# Patient Record
Sex: Male | Born: 1965 | Race: White | Hispanic: No | Marital: Married | Smoking: Never smoker
Health system: Southern US, Community
[De-identification: ages and names within clinical notes are randomized; demographics above are authoritative.]

## PROBLEM LIST (undated history)

## (undated) DIAGNOSIS — G4730 Sleep apnea, unspecified: Secondary | ICD-10-CM

## (undated) DIAGNOSIS — C801 Malignant (primary) neoplasm, unspecified: Secondary | ICD-10-CM

## (undated) HISTORY — PX: HYDROCELE EXCISION: SHX482

## (undated) HISTORY — PX: FOOT FRACTURE SURGERY: SHX645

## (undated) HISTORY — PX: KNEE SURGERY: SHX244

## (undated) HISTORY — PX: COLONOSCOPY: SHX174

---

## 2010-10-20 ENCOUNTER — Ambulatory Visit (INDEPENDENT_AMBULATORY_CARE_PROVIDER_SITE_OTHER): Admitting: Sports Medicine

## 2010-10-20 VITALS — BP 120/80

## 2010-10-20 DIAGNOSIS — M7710 Lateral epicondylitis, unspecified elbow: Secondary | ICD-10-CM

## 2010-10-20 MED ORDER — NITROGLYCERIN 0.2 MG/HR TD PT24: MEDICATED_PATCH | TRANSDERMAL | Status: DC

## 2010-10-20 NOTE — Patient Instructions (Signed)
1. Do eccentric wrist exercises, 3 sets of 10 reps daily.  1. Down fast, up slow.  2. Rotate out fast, back slow.  2. Follow up in 4-6 weeks.  3. Wear your compression sleeve daily.  4. Over the counter ibuprofen as needed for pain.

## 2010-10-21 DIAGNOSIS — M7710 Lateral epicondylitis, unspecified elbow: Secondary | ICD-10-CM | POA: Insufficient documentation

## 2010-10-21 NOTE — Progress Notes (Signed)
  Subjective:    Patient ID: Thomas Marquez, male    DOB: Feb 21, 1965, 45 y.o.   MRN: [id number]  HPI 45 y/o male is here for a second opinion on his bilateral tennis elbow.  He has been treated with injections, NSAIDs, and physical therapy.  The right side responded reasonably well but the left contiues to be painful.  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   Review of Systems     Objective:   Physical Exam  Right Elbow: No tenderness to palpation No edema, ecchymosis or deformity Motion is normal  No pain with pronation or wrist flexion against resistance No pain with holding a book at 90 degrees with the arm fully extended  Left Elbow tenderness to palpation over the lateral epicondyle No edema, ecchymosis or deformity Motion is normal  Pain with pronation and wrist flexion against resistance Pain with holding a book a 90 degrees with the arm fully extended  Ultrasound:  Right elbow: viewed extensor tendons at the insertion in the long and short view. Minimal amt of fluid around the tendon sheath.  A tiny spur on the epicondyle ege and small calcifications c/w chronic inflammation right at the insertion point  Left elbow: viewed the extensor tendons at the insertion in the long and short view.  Moderate amount of fluid around the tendon sheath. Moderate sized calcification was visualized just distal to the insertion.        Assessment & Plan:

## 2010-10-21 NOTE — Assessment & Plan Note (Addendum)
Starting NTG therapy and eccentric exercises for rehab.  We gave him an elbow sleeve to wear for comfort on the right. He will follow up in 4-6 weeks.  At that time we will also evaluate his calf size discrepancy.

## 2010-11-17 ENCOUNTER — Ambulatory Visit (INDEPENDENT_AMBULATORY_CARE_PROVIDER_SITE_OTHER): Admitting: Sports Medicine

## 2010-11-17 ENCOUNTER — Encounter: Payer: Self-pay | Admitting: Sports Medicine

## 2010-11-17 VITALS — BP 117/73 | HR 60

## 2010-11-17 DIAGNOSIS — R269 Unspecified abnormalities of gait and mobility: Secondary | ICD-10-CM

## 2010-11-17 DIAGNOSIS — M7710 Lateral epicondylitis, unspecified elbow: Secondary | ICD-10-CM

## 2010-11-17 DIAGNOSIS — M2170 Unequal limb length (acquired), unspecified site: Secondary | ICD-10-CM

## 2010-11-17 NOTE — Patient Instructions (Signed)
1. Increase your patch to half a patch daily.  2. Start your exercises 3 sets of 10 once or twice daily.  3. Only wear during vigorous activity.  4. Do wrist exercises only to your comfort level.  5. Follow up in one month.

## 2010-11-17 NOTE — Assessment & Plan Note (Signed)
Increasing the NTG to 1/2 patch daily to achieve more absorption.  Wearing the elbow sleeve all day long delivered too much compression to the area.  He will only use it when he is doing certain aggravating activities at work or around the house.  He will back off on his rehab exercises until he can do them without much pain and then work back up.  We will see him again in about 5-6 weeks.

## 2010-11-17 NOTE — Assessment & Plan Note (Signed)
Will use lift in running shoes but if needed will add to other shoes

## 2010-11-17 NOTE — Progress Notes (Signed)
  Subjective:    Patient ID: Thomas Marquez, male    DOB: [birth year], 45 y.o.   MRN: [id number]  HPI  45 y/o male is here to follow up on left lateral epicondylitis.  The pain is somewhat worse.  He was initially wearing his elbow sleeve all day long and noticed the elbow is very achy at the end of the day.  This aching increases with his rehab exercises.  He has stopped wearing the elbow sleeve.    He is also c/o a difference in the size of his calves.  He started running about 3 years ago and noticed that the right calf has increased in size but the left is the same as before.  His wife notices that he has a slight limp with walking.  He sometimes gets right achilles tendon pain after running.    Review of Systems No headaches or dizziness    Objective:   Physical Exam  Left Elbow: No erythema or swelling Tender to palpation over the lateral epicondyle ROM normal No real change on MSK [place] of left elbow w intact extensor tendons and some linear calcification noted in proximal mm belly  Right leg measures 95 cm and left measures 93 cm The left calf is much smaller than the right Left leg is 1.5 cm shorter than RT No tenderness to palpation of either calf or achilles tendon The abductor and quad strength are normal at the hip FABER is non-painful, 45 degrees Flexes at the hip to about 45 degrees on the left and 60 degrees on the right  He has a valgus deviation of the left foot with neutral standing His posterior tibialis function is normal   Gait: He has a Trendelenburg gait.  He lands flat footed on the right side.  This is exaggerated with running.  His push off is all right sided.  He gets almost no push off on the left.  After putting the wedge on the left side there is some improvement.       Assessment & Plan:

## 2010-11-17 NOTE — Assessment & Plan Note (Signed)
Added a heel lift with wedge to the left foot  to start correcting the length difference.  The wedge should also decrease the valgus deviation of the left foot.

## 2010-12-22 ENCOUNTER — Ambulatory Visit (INDEPENDENT_AMBULATORY_CARE_PROVIDER_SITE_OTHER): Admitting: Sports Medicine

## 2010-12-22 VITALS — BP 126/80

## 2010-12-22 DIAGNOSIS — M79609 Pain in unspecified limb: Secondary | ICD-10-CM

## 2010-12-22 DIAGNOSIS — M79672 Pain in left foot: Secondary | ICD-10-CM

## 2010-12-22 DIAGNOSIS — M7710 Lateral epicondylitis, unspecified elbow: Secondary | ICD-10-CM

## 2010-12-22 MED ORDER — AMITRIPTYLINE HCL 25 MG PO TABS: 25.0000 mg | ORAL_TABLET | Freq: Every day | ORAL | Status: DC

## 2010-12-22 NOTE — Patient Instructions (Signed)
1. Continue to do your elbow exercises and your calf exercises.  2. Start wearing your arch straps daily.    3. If your heel pain doesn't improve over the next couple of weeks call to schedule an appointment for custom orthotics.  4. Start taking your amitriptyline at night.  A common side effect of this medication is sedation.  It usually resolves as you get used to the medication.  If it does not improve please give [place] a call.  5. Use capsaicin cream on your elbow 2-3 times a day.  6. Stop using your nitro patch.  7. Sleep with a pillow in your left arm.  8. Start doing your plantar fascia stretching exercises 1-2 times a day.  9. Schedule your next follow up appointment for 6 weeks.

## 2010-12-23 NOTE — Progress Notes (Signed)
  Subjective:    Patient ID: Thomas Marquez, male    DOB: 11/07/1965, 45 y.o.   MRN: [id number]  HPI 45 y/o male is here to follow up on his elbow pain and gait disturbance.  His elbow pain is essentially unchanged from before.  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  He has been wearing 1/2 NTG patch daily with no side effects.  He is doing well with his insoles.  However, he developed some heel pain on the right heel after he tried to return to running.   Review of Systems     Objective:   Physical Exam Elbow: No erythema, no deformity Tender over the lateral epicondyle Flexes, extends, supinates, and pronates without difficulty No pain with wrist extension against resistance  Pain is reproduced with lifting his sneaker from the exam table  Foot: No edema Tender to palpation at the insertion of the plantar fascia No tenderness otherwise  Ultrasound: Foot: Achilles tendon 0.55 CM at the insertion.  Calcifications noted inside the fascia.  Bone spur noted at the calcaneus.  Elbow: Extensor tendon intact.  Calcification that was previously noted at the epicondyle resolved.  There is no doppler activity.        Assessment & Plan:

## 2010-12-26 DIAGNOSIS — M79672 Pain in left foot: Secondary | ICD-10-CM | POA: Insufficient documentation

## 2010-12-26 NOTE — Assessment & Plan Note (Signed)
[place] evidence for lateral epicondylitis has resolved  Suspect there may be some neuropathic injury to left elbow  Add amitriptyline Keep up conservative exercises and streches
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
## 2011-02-04 ENCOUNTER — Ambulatory Visit (INDEPENDENT_AMBULATORY_CARE_PROVIDER_SITE_OTHER): Admitting: Sports Medicine

## 2011-02-04 VITALS — BP 110/70

## 2011-02-04 DIAGNOSIS — R269 Unspecified abnormalities of gait and mobility: Secondary | ICD-10-CM

## 2011-02-04 DIAGNOSIS — M7710 Lateral epicondylitis, unspecified elbow: Secondary | ICD-10-CM

## 2011-02-04 DIAGNOSIS — M2170 Unequal limb length (acquired), unspecified site: Secondary | ICD-10-CM

## 2011-02-04 DIAGNOSIS — M722 Plantar fascial fibromatosis: Secondary | ICD-10-CM | POA: Insufficient documentation

## 2011-02-04 NOTE — Progress Notes (Signed)
  Subjective:    Patient ID: Thomas Marquez, male    DOB: Feb 08, 1965, 46 y.o.   MRN: [id number]  HPI  Pt presents to clinic for f/u of lt elbow and heel pain. Elbow is about 30% improved. Started amitriptyline and did see improvement in pain, but stopped due to excessive daytime grogginess.  He used amitrip for 1 month  Left heel is unchanged Not clear that he has had improvement in sxs with sports insoles Has 2 cm leg length difference - never corrected Plantar fascia scan Did show thickening at 0.55 cm He also had calcifications and a small spur   Review of Systems     Objective:   Physical Exam   NAD Lt leg 2 cm shorter  Heel is somewhat ttp at medial insertion on left Atrophy of left lower leg compared to rt AT is non tender and no swelling  Gait with trendelenburg  Arch is moderate      Assessment & Plan:

## 2011-02-04 NOTE — Assessment & Plan Note (Signed)
This is improved Keep up exercises  I think he should try to use the amitriptyline for a longer period of time Try cutting the pill in half and using it for another 2 months Recheck after that time

## 2011-02-04 NOTE — Assessment & Plan Note (Signed)
This is improved with orthotic corrections

## 2011-02-04 NOTE — Patient Instructions (Signed)
Please try cutting the amitriptyline in half to see if this helps with daytime drowsiness  Wear new orthotics as much as possible  Continue doing exercises for left elbow and heel  Please follow up in 2 months  Happy New Year!

## 2011-02-04 NOTE — Assessment & Plan Note (Signed)
With his chronic symptoms we will try him in custom orthotics  Patient was fitted for a standard, cushioned, semi-rigid orthotic.  The orthotic was heated and the patient stood on the orthotic blank positioned on the orthotic stand. The patient was positioned in subtalar neutral position and 10 degrees of ankle dorsiflexion in a weight bearing stance. After molding, a stable Fast-Tech EVA base was applied to the orthotic blank.   The blank was ground to a stable position for weight bearing. Size: red cambray size 11 base: Blue EVA posting: Left tapered burgundy EVA 1 cm lift additional orthotic padding: nonetime 45 mins

## 2011-02-04 NOTE — Assessment & Plan Note (Signed)
Correction is added to the orthotic with a tapered 1 cm lift

## 2011-02-05 ENCOUNTER — Other Ambulatory Visit: Payer: Self-pay | Admitting: *Deleted

## 2011-02-05 MED ORDER — AMITRIPTYLINE HCL 10 MG PO TABS: 10.0000 mg | ORAL_TABLET | Freq: Every day | ORAL | Status: DC

## 2013-02-02 DIAGNOSIS — T8859XA Other complications of anesthesia, initial encounter: Secondary | ICD-10-CM

## 2013-02-02 HISTORY — DX: Other complications of anesthesia, initial encounter: T88.59XA

## 2013-03-06 ENCOUNTER — Ambulatory Visit: Payer: Self-pay | Admitting: Orthopedic Surgery

## 2013-04-04 ENCOUNTER — Ambulatory Visit: Payer: Self-pay | Admitting: Orthopedic Surgery

## 2014-05-26 NOTE — Op Note (Signed)
PATIENT NAME:  Thomas Marquez, Thomas Marquez MR#:  [id number] DATE OF BIRTH:  [birth year]  DATE OF PROCEDURE:  04/04/2013  PREOPERATIVE DIAGNOSIS: Left knee medial meniscus tear, degenerative joint disease.   POSTOPERATIVE DIAGNOSES:  1.  Medial meniscus tear, posterior horn.  2.  Large plica, medial aspect impinging on the medial femoral trochlea.  3.  Chondromalacia.   SURGEON: Dawayne Patricia, M.D.   ASSISTANT: None.   TOURNIQUET TIME:  0.  ANESTHESIA: General anesthesia.   SURGICAL FINDINGS: Large plica impinging on medial femoral trochlea with underlying grade III chondral wear. Grade I to II chondral wear medial and lateral compartments in weight-bearing surfaces. Large free edge tear, posterior horn medial meniscus.   INDICATIONS FOR PROCEDURE: Thomas Marquez is a 49 year old gentleman who was referred for consideration of surgical intervention. He and his wife had been doing a lot more running. He began to experience pain which he localized to the medial aspect of the knee. MRI confirmed the presence of some degenerative changes, as well as the presence of a free flap tear of the medial meniscus, posterior horn. Risks and benefits were explained to the patient and his wife. They agreed to proceed with surgical intervention.   The patient was identified in the preoperative holding area. Left lower extremity was marked as the operative site. The patient was brought into the operating room and general anesthesia was administered. The left lower extremity was prepared and draped in the usual sterile fashion. Tourniquet was applied but not inflated.   Timeout was performed identifying the patient, procedure, laterality, confirming antibiotic administration, confirming skin preparation and site marking.   Standard lateral viewing portal was made. Arthroscope was inserted into the patellofemoral articulation. There was very extensive synovitis throughout. The patient had a very large plical band covering a  large portion of the medial femoral trochlea. Medial and lateral gutters were checked for loose bodies. No loose bodies were present. The medial compartment was entered, and under direct visualization, medial portal was made. Probe was inserted through the medial portal and the medial meniscus was probed. The patient was found to have a large free edge flap tear of the posterior horn of medial meniscus. Using a combination of meniscal biters and shaver, a partial meniscectomy was performed, and the meniscal tear was resected back to a stable edge. Probe was inserted to evaluate the cartilaginous surfaces. Directly in line with the prior meniscal flap was a superficial defect in the medial femoral condyle. On probing, the patient is found to have significant softening of the medial femoral condyle and medial tibial plateau.  There was no significant cartilage loss noted. Attention was turned to the notch. The ACL and PCL were  visualized and intact. Attention was now turned to the lateral compartment. The lateral meniscus is probed along its entire surface and found to be stable and free of tearing. The lateral femoral condyle and lateral tibial plateau were probed, and again, cartilaginous surfaces and found to be quite soft without any significant defects noted.   At this time, attention was returned to the patellofemoral articulation. A probe was inserted and the plical band was retracted in order to reveal the underlying medial femoral trochlea. There was quite significant degenerative changes here. 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 Shaver and biters were used to resect the large plical band to ensure that it no longer impinged on the medial femoral trochlea. Undersurface of the patella is probed. This  is found to have some cartilaginous softening with no significant cartilage loss.   The joint was washed arthroscopically and drained.  Medial and lateral gutters were again checked for loose bodies. Instruments and camera were removed. Portals were closed using nylon suture. Sterile dressings were applied. The patient was placed in a hinged knee brace. He will follow up on Friday.      ____________________________ Dawayne Patricia, MD sr:dmm D: 04/04/2013 14:53:16 ET T: 04/04/2013 20:32:05 ET JOB#: [id number]  cc: Dawayne Patricia, MD, <Dictator> Dawayne Patricia MD ELECTRONICALLY SIGNED 04/06/2013 9:43

## 2017-07-01 ENCOUNTER — Ambulatory Visit (INDEPENDENT_AMBULATORY_CARE_PROVIDER_SITE_OTHER): Admitting: Sports Medicine

## 2017-07-01 ENCOUNTER — Encounter: Payer: Self-pay | Admitting: Sports Medicine

## 2017-07-01 DIAGNOSIS — M2170 Unequal limb length (acquired), unspecified site: Secondary | ICD-10-CM

## 2017-07-01 NOTE — Progress Notes (Signed)
   HPI  CC: Leg length discrepancy (R>L) Patient is here for new custom orthotics to help correct this discrepancy.  He states that he has had excellent improvement in symptoms since receiving a previous pair of orthotics.  This pair has since worn out as they are numerous years old.  He denies any new injury, trauma, or event to exacerbate any have his discomfort.  He denies any weakness, numbness, or paresthesias.  Overall he feels quite well.  Medications/Interventions Tried: Administrator  See HPI and/or previous note for associated ROS.  Objective: BP 123/75   Ht 5\' 8"  (1.727 m)   Wt 200 lb (90.7 kg)   BMI 30.41 kg/m  Gen: NAD, well groomed, a/o x3, normal affect.  CV: Well-perfused. Warm.  Resp: Non-labored.  Neuro: Sensation intact throughout. No gross coordination deficits.  Gait: Nonpathologic posture, unremarkable stride without signs of limp or balance issues. Ankle/Foot, Bilateral: No TTP appreciated on exam.  Leg length discrepancy, right leg > left leg, present. No visible erythema, swelling, ecchymosis, or bony deformity. Mild/moderate pes planus changes (L>R). Transverse arch grossly intact; Mild tibiotalar valgus deviationon right side; Range of motion is full in all directions. Strength is 5/5 in all directions. No plantar calcaneal tenderness; No tenderness at the distal metatarsals; Able to walk 4 steps.   Custom Orthotics: - Patient was fitted for a standard, cushioned, semi-rigid orthotic. - Orthotic was heated and afterward the patient stood on the orthotic blank positioned on the orthotic stand. - The patient was positioned in subtalar neutral position and 10 degrees of ankle dorsiflexion in a weight bearing stance. - After completion of molding, a stable base was applied to the orthotic blank. - The blank was ground to a stable position for weight bearing. - Size: 10 - Base: Blue EVA Bilaterally >> additional White EVA added to Left side - Additional Posting  and Padding: (see above) - The patient ambulated in these, and they were very comfortable.  Note LLI is about 2 cms and we corrected about 1 cm.   Assessment and plan:  Leg length inequality Patient is here for new custom orthotics to help correct his leg length discrepancy. -Patient ambulated in the orthotics and endorsed their comfort. -Follow-up PRN   I spent 30 minutes with this patient. Over 50% of visit was spent in counseling and coordination of care for problems with leg length discrepancy.   Elberta Leatherwood, MD,MS [facility] 07/01/2017 3:07 PM  I observed and examined the patient with the [facility] and agree with assessment and plan.  Note reviewed and modified by me. Stefanie Libel, MD

## 2017-07-01 NOTE — Assessment & Plan Note (Signed)
Patient is here for new custom orthotics to help correct his leg length discrepancy. -Patient ambulated in the orthotics and endorsed their comfort. -Follow-up PRN

## 2018-11-03 ENCOUNTER — Ambulatory Visit: Admitting: Sports Medicine

## 2018-11-24 ENCOUNTER — Encounter: Payer: Self-pay | Admitting: Sports Medicine

## 2018-11-24 ENCOUNTER — Ambulatory Visit: Payer: Self-pay

## 2018-11-24 ENCOUNTER — Ambulatory Visit (INDEPENDENT_AMBULATORY_CARE_PROVIDER_SITE_OTHER): Admitting: Sports Medicine

## 2018-11-24 ENCOUNTER — Other Ambulatory Visit: Payer: Self-pay

## 2018-11-24 VITALS — BP 126/84 | Ht 68.5 in | Wt 220.0 lb

## 2018-11-24 DIAGNOSIS — M25571 Pain in right ankle and joints of right foot: Secondary | ICD-10-CM | POA: Diagnosis not present

## 2018-11-24 DIAGNOSIS — M722 Plantar fascial fibromatosis: Secondary | ICD-10-CM | POA: Diagnosis not present

## 2018-11-24 MED ORDER — AMITRIPTYLINE HCL 25 MG PO TABS: 25.0000 mg | ORAL_TABLET | Freq: Every day | ORAL | 1 refills | Status: DC

## 2018-11-24 MED ORDER — AMITRIPTYLINE HCL 10 MG PO TABS: 10.0000 mg | ORAL_TABLET | Freq: Two times a day (BID) | ORAL | 1 refills | Status: DC | PRN

## 2018-11-24 NOTE — Progress Notes (Signed)
Thomas Marquez is a 53 y.o. male who presents to [facility] today for the following:  Right Heel Pain Pain since February, stable pain, no better Worse with wearing steel toe boots for long periods, painful to bear weight, hurts first thing in the morning as well When it first started, he had pain only in morning and in the evening Has hx plantar fasciitis thought that was it In May, pain was getting more constant and was extending up the posterior heel into the calf, did not move into arch, so though it was something different Dr. Hassell Done was seen in May and diagnosed with achilles tendinitis Gave prednisone taper, put in CAM walker boot, sent to podiatry Podiatry said it's plantar fasciitis and gave injection end of May, had improvement after that but it never fully went away. About 6 weeks later, went in for check up and was given another one because he was still at about 50%.  No improvement after that shot. In July, went back to podiatry again and was put in a CAM walker boot again and sent to PT.  Did about 4 sessions and PT and had no improvement.  Occasionally takes ibuprofen 800mg  QD-BID, helps a little Occasional icing  In the boot, he has no pain, but when he is in a shoe, pain comes back quickly Still doing Achilles stretches on right a few times a day, 3 sets 30 sec each  No left foot pain, no knee pain No history of injury or prior heel pain Reports remote forefoot surgery on right foot many years ago after injury in [place] Has not had any imaging for this problem Pain was waking him up at night when first started, but not lately  PMH reviewed.  ROS as above. Medications reviewed.  Exam:  BP 126/84   Ht 5' 8.5" (1.74 m)   Wt 220 lb (99.8 kg)   BMI 32.96 kg/m  Gen: Well NAD  Right Foot: Inspection:  No obvious bony deformity bilaterally.  No swelling, erythema, or bruising bilaterally.  Decreased medial arch L>R Palpation: TTP tarsal tunnel on right, TTP location of medial  plantar nerve on right, TTP medial calcaneus  ROM: Full  ROM of the ankle. Normal midfoot flexibility Strength: 5/5 strength ankle in all planes Neurovascular: N/V intact distally in the lower extremity Special tests: Negative anterior drawer. Negative squeeze. normal midfoot flexibility. Normal calcaneal motion with heel raise  Limited Ultrasound Right Heel : Hypoechoic signal surrounding branches of tibial nerve on right foot.  No Calcaneal spurs or os calcis bony deformities.   Plantar fascia appears mildly thickened at 0.69 CM. No hypoechoic change.   Achilles tendon was normal in appearance and width.  Impression: no noted Achilles tendinopathy and mild plantar thickening but findings do not suggest plantar fasciitis.  Ultrasound and interpretation by Thomas Marquez. 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  Patient also with tenderness palpation of tarsal tunnel and medial plantar nerve distribution on right.  Limited ultrasound performed today showing increased fluid surrounding branches of right tibial nerve.  Further supports diagnosis of Baxter's neuropathy.  It is likely that patient had some plantar fascial involvement at one point as well, and had mild thickening of plantar fascia, but is not  experiencing symptoms at this time.  We will treat with vitamin B6 50 mg daily that he can obtain over-the-counter, Elavil 20 mg nightly, will also give Elavil 10 mg to use twice during the day as needed if it does not make him too sleepy.  Advised that he can use the CAMwalker boot if helps, but does not need to.  Can use comfortable shoes.  Would avoid steel toes at this time until follow-up in 4 weeks.  Also given a heel cup to place on top of his custom  orthotics made previously.  Advised warm water soaks of the right foot as well, and to avoid ice.   Thomas Marquez, D.O.  PGY-2 Family Medicine  11/24/2018 5:26 PM   I observed and examined the patient with the resident and agree with assessment and plan.  Note reviewed and modified by me. Thomas Marquez

## 2018-11-24 NOTE — Patient Instructions (Signed)
You have an irritation of the nerves on the inside of your foot Get Vitamin B6 50mg  and take once a day.  This is over the counter and you can get it from the pharmacy. We have prescribed you Elavil.  You should take 25 mg tablets every night.  If it does not make you too drowsy, you can take 10 mg up to twice during the day.  If you take all of these tablets, this would give you 3 times a day that you are taking Elavil.  If you get too drowsy, stop taking the Elavil during the day. You can also use warm water soaks for your foot.  Avoid using ice as this can make nerve pain worse. We have given you a note for work, continue to use soft shoes.  We will also give you a heel cup to use on your inserts.

## 2018-11-24 NOTE — Assessment & Plan Note (Addendum)
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  Patient also with tenderness palpation of tarsal tunnel and medial plantar nerve distribution on right.  Limited ultrasound performed today showing increased fluid surrounding branches of right tibial nerve.  Further supports diagnosis of Baxter's neuropathy.  It is likely that patient had some plantar fascial involvement at one point as well, and had mild thickening of plantar fascia, but is not experiencing symptoms at this time.  We will treat with vitamin B6 50 mg daily that he can obtain over-the-counter, Elavil 20 mg nightly, will also give Elavil 10 mg to use twice during the day as needed if it does not make him too sleepy.  Advised that he can use the CAMwalker boot if helps, but does not need to.  Can use comfortable shoes.  Would avoid steel toes at this time until follow-up in 4 weeks.  Also given a heel cup to place on top of his custom orthotics made previously.  Advised warm water soaks of the right foot as well, and to avoid ice.

## 2018-12-15 ENCOUNTER — Ambulatory Visit (INDEPENDENT_AMBULATORY_CARE_PROVIDER_SITE_OTHER): Admitting: Sports Medicine

## 2018-12-15 ENCOUNTER — Other Ambulatory Visit: Payer: Self-pay

## 2018-12-15 ENCOUNTER — Encounter: Payer: Self-pay | Admitting: Sports Medicine

## 2018-12-15 DIAGNOSIS — M25571 Pain in right ankle and joints of right foot: Secondary | ICD-10-CM | POA: Diagnosis not present

## 2018-12-15 MED ORDER — AMITRIPTYLINE HCL 25 MG PO TABS: ORAL_TABLET | ORAL | 3 refills | Status: DC

## 2018-12-15 NOTE — Progress Notes (Signed)
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

## 2018-12-15 NOTE — Assessment & Plan Note (Signed)
Responding to amitriptyline Will increase QHS dose to 50 mg Can use 10 during morning or BID if needed Continue in softer shoe Use heel cup Reck 6 weeks

## 2019-01-12 ENCOUNTER — Emergency Department
Admission: EM | Admit: 2019-01-12 | Discharge: 2019-01-12 | Disposition: A | Discharge status: Home / Self Care | Attending: Emergency Medicine | Admitting: Emergency Medicine

## 2019-01-12 ENCOUNTER — Other Ambulatory Visit: Payer: Self-pay

## 2019-01-12 ENCOUNTER — Emergency Department

## 2019-01-12 DIAGNOSIS — Z85828 Personal history of other malignant neoplasm of skin: Secondary | ICD-10-CM | POA: Insufficient documentation

## 2019-01-12 DIAGNOSIS — K6289 Other specified diseases of anus and rectum: Secondary | ICD-10-CM | POA: Insufficient documentation

## 2019-01-12 HISTORY — DX: Malignant (primary) neoplasm, unspecified: C80.1

## 2019-01-12 LAB — URINALYSIS, COMPLETE (UACMP) WITH MICROSCOPIC
Bacteria, UA: NONE SEEN
Bilirubin Urine: NEGATIVE
Glucose, UA: NEGATIVE mg/dL
Hgb urine dipstick: NEGATIVE
Ketones, ur: NEGATIVE mg/dL
Leukocytes,Ua: NEGATIVE
Nitrite: NEGATIVE
Protein, ur: NEGATIVE mg/dL
Specific Gravity, Urine: 1.021 (ref 1.005–1.030)
pH: 5 (ref 5.0–8.0)

## 2019-01-12 LAB — CBC
HCT: 47.7 % (ref 39.0–52.0)
Hemoglobin: 16.2 g/dL (ref 13.0–17.0)
MCH: 28.9 pg (ref 26.0–34.0)
MCHC: 34 g/dL (ref 30.0–36.0)
MCV: 85.2 fL (ref 80.0–100.0)
Platelets: 286 10*3/uL (ref 150–400)
RBC: 5.6 MIL/uL (ref 4.22–5.81)
RDW: 12.5 % (ref 11.5–15.5)
WBC: 7.1 10*3/uL (ref 4.0–10.5)
nRBC: 0 % (ref 0.0–0.2)

## 2019-01-12 LAB — BASIC METABOLIC PANEL
Anion gap: 8 (ref 5–15)
BUN: 14 mg/dL (ref 6–20)
CO2: 28 mmol/L (ref 22–32)
Calcium: 8.5 mg/dL — ABNORMAL LOW (ref 8.9–10.3)
Chloride: 105 mmol/L (ref 98–111)
Creatinine, Ser: 0.87 mg/dL (ref 0.61–1.24)
GFR calc Af Amer: >60 mL/min (ref >60)
GFR calc non Af Amer: >60 mL/min (ref >60)
Glucose, Bld: 102 mg/dL — ABNORMAL HIGH (ref 70–99)
Potassium: 4 mmol/L (ref 3.5–5.1)
Sodium: 141 mmol/L (ref 135–145)

## 2019-01-12 MED ORDER — TRAMADOL HCL 50 MG PO TABS: 50.0000 mg | ORAL_TABLET | Freq: Four times a day (QID) | ORAL | 0 refills | Status: AC | PRN

## 2019-01-12 MED ORDER — OXYCODONE-ACETAMINOPHEN 5-325 MG PO TABS
1.0000 | ORAL_TABLET | Freq: Once | ORAL | Status: AC
  Administered 2019-01-12: 05:00:00 1 via ORAL
  Filled 2019-01-12: qty 1

## 2019-01-12 MED ORDER — IOHEXOL 300 MG/ML  SOLN
100.0000 mL | Freq: Once | INTRAMUSCULAR | Status: AC | PRN
  Administered 2019-01-12: 100 mL via INTRAVENOUS

## 2019-01-12 NOTE — ED Provider Notes (Signed)
[facility] Emergency Department Provider Note  ____________________________________________  Time seen: Approximately 6:19 AM  I have reviewed the triage vital signs and the nursing notes.   HISTORY  Chief Complaint Rectal Pain   HPI Thomas Marquez is a 53 y.o. male who presents for evaluation of rectal pain.  Patient reports that he has been having intermittent pain for the last few days but this evening the pain was severe and he has been able to sleep.  He palpated the area and did not notice a hemorrhoid.  He tried Preparation H.  He reports a history of prior hemorrhoids.  He denies abdominal pain, dysuria, hematuria, nausea, vomiting, fever or chills.  Patient reports having colonoscopy a year ago at the [place] which was completely unremarkable.  Unfortunately with no have access to the [place] records.  He denies history of prostate cancer or prostatitis.  He describes the pain as dull, severe, constant, worse with bowel movements.   Past Medical History:  Diagnosis Date   Cancer ([place])    skin cancer on leg.    Patient Active Problem List   Diagnosis Date Noted   Pain in joint involving right ankle and foot 11/24/2018   Plantar fascia syndrome 02/04/2011   Foot pain, left 12/26/2010   Abnormal gait 11/17/2010   Leg length inequality 11/17/2010   Epicondylitis, lateral 10/21/2010    No past surgical history on file.  Prior to Admission medications   Medication Sig Start Date End Date Taking? Authorizing Provider  amitriptyline (ELAVIL) 10 MG tablet Take 1 tablet (10 mg total) by mouth 2 (two) times daily as needed for sleep. 11/24/18   Stefanie Libel, MD  amitriptyline (ELAVIL) 25 MG tablet Take two tabs at bedtime. 12/15/18   Stefanie Libel, MD  nitroGLYCERIN (NITRODUR - DOSED IN MG/24 HR) 0.2 mg/hr APPLY 1/4 OF A PATCH TO AFFECTED AREA EVERY 24 HOURS. MOVE PATCH TO  A DIFFERENT POSITION EACH APPLICATION [id number]   Stefanie Libel, MD  traMADol (ULTRAM)  50 MG tablet Take 1 tablet (50 mg total) by mouth every 6 (six) hours as needed for moderate pain or severe pain. 01/12/19 01/12/20  Rudene Re, MD    Allergies Patient has no known allergies.  No family history on file.  Social History Social History   Tobacco Use   Smoking status: Never Smoker   Smokeless tobacco: Never Used  Substance Use Topics   Alcohol use: Not on file   Drug use: Not on file    Review of Systems  Constitutional: Negative for fever. Eyes: Negative for visual changes. ENT: Negative for sore throat. Neck: No neck pain  Cardiovascular: Negative for chest pain. Respiratory: Negative for shortness of breath. Gastrointestinal: Negative for abdominal pain, vomiting or diarrhea. Genitourinary: Negative for dysuria. + rectal pain Musculoskeletal: Negative for back pain. Skin: Negative for rash. Neurological: Negative for headaches, weakness or numbness. Psych: No SI or HI  ____________________________________________   PHYSICAL EXAM:  VITAL SIGNS: ED Triage Vitals [01/12/19 0046]  Enc Vitals Group     BP (!) 149/101     Pulse Rate 68     Resp 20     Temp 97.9 F (36.6 C)     Temp Source Oral     SpO2 97 %     Weight 225 lb (102.1 kg)     Height 5\' 8"  (1.727 m)     Head Circumference      Peak Flow      Pain  Score 9     Pain Loc      Pain Edu?      Excl. in [place]?     Constitutional: Alert and oriented. Well appearing and in no apparent distress. HEENT:      Head: Normocephalic and atraumatic.         Eyes: Conjunctivae are normal. Sclera is non-icteric.       Mouth/Throat: Mucous membranes are moist.       Neck: Supple with no signs of meningismus. Cardiovascular: Regular rate and rhythm. No murmurs, gallops, or rubs. 2+ symmetrical distal pulses are present in all extremities. No JVD. Respiratory: Normal respiratory effort. Lungs are clear to auscultation bilaterally. No wheezes, crackles, or rhonchi.  Gastrointestinal: Soft,  non tender, and non distended with positive bowel sounds. No rebound or guarding. Genitourinary: Rectal exam is normal with no fissure, no hemorrhoid, no prolapse, digital exam of the prostate also normal with no boggy prostate, no tenderness.  No obvious internal hemorrhoids.  GU exam also normal with no evidence of cellulitis, no crepitus, no testicular tenderness, no penile discharge, no ulcers Musculoskeletal: Nontender with normal range of motion in all extremities. No edema, cyanosis, or erythema of extremities. Neurologic: Normal speech and language. Face is symmetric. Moving all extremities. No gross focal neurologic deficits are appreciated. Skin: Skin is warm, dry and intact. No rash noted. Psychiatric: Mood and affect are normal. Speech and behavior are normal.  ____________________________________________   LABS (all labs ordered are listed, but only abnormal results are displayed)  Labs Reviewed  URINALYSIS, COMPLETE (UACMP) WITH MICROSCOPIC - Abnormal; Notable for the following components:      Result Value   Color, Urine YELLOW (*)    APPearance CLEAR (*)    All other components within normal limits  BASIC METABOLIC PANEL - Abnormal; Notable for the following components:   Glucose, Bld 102 (*)    Calcium 8.5 (*)    All other components within normal limits  CBC   ____________________________________________  EKG  none  ____________________________________________  RADIOLOGY  I have personally reviewed the images performed during this visit and I agree with the Radiologist's read.   Interpretation by Radiologist:  CT ABDOMEN PELVIS W CONTRAST  Result Date: 01/12/2019 CLINICAL DATA:  Rectal pain.  History of hemorrhoids EXAM: CT ABDOMEN AND PELVIS WITH CONTRAST TECHNIQUE: Multidetector CT imaging of the abdomen and pelvis was performed using the standard protocol following bolus administration of intravenous contrast. CONTRAST:  138mL OMNIPAQUE IOHEXOL 300 MG/ML   SOLN COMPARISON:  None. FINDINGS: Lower chest:  No contributory findings. Hepatobiliary: Hepatic steatosis. No focal lesion.No evidence of biliary obstruction or stone. Pancreas: Unremarkable. Spleen: Small granulomatous type calcification. Adrenals/Urinary Tract: Negative adrenals. No hydronephrosis or stone. Unremarkable bladder. Stomach/Bowel: No obstruction. No appendicitis. No abnormality seen around the symptomatic rectum. Vascular/Lymphatic: No acute vascular abnormality. Mild iliac atherosclerotic calcification. No mass or adenopathy. Reproductive:Mild dystrophic calcification in the left aspect prostate which is mildly enlarged with projection into the bladder base. Other: No ascites or pneumoperitoneum. Musculoskeletal: No acute abnormalities. IMPRESSION: 1. No explanation for abdominal pain. 2. Hepatic steatosis. Electronically Signed   By: Monte Fantasia M.D.   On: 01/12/2019 06:14     ____________________________________________   PROCEDURES  Procedure(s) performed: None Procedures Critical Care performed:  None ____________________________________________   INITIAL IMPRESSION / ASSESSMENT AND PLAN / ED COURSE   53 y.o. male who presents for evaluation of rectal pain.  Patient is well-appearing in no distress with normal vitals, rectal exam  is benign with no evidence of anal fissure, internal or external hemorrhoids, rectal prolapse, cellulitis/necrotizing fasciitis, prostatitis.  Labs are within normal limits.  CT done to rule out signs of proctitis, diverticulitis, prostatitis and showed no acute findings.  According to patient he had a normal colonoscopy a year ago.  Unclear etiology of the pain.  Will give a short course of tramadol to help patient sleep.  Recommend follow-up with the GI doctor at the [facility] for anoscopy.  Discussed my standard return precautions       As part of my medical decision making, I reviewed the following data within the [place] notes reviewed and incorporated, Labs reviewed , Old chart reviewed, Radiograph reviewed , Notes from prior ED visits and [place] Controlled Substance Database   Please note:  Patient was evaluated in Emergency Department today for the symptoms described in the history of present illness. Patient was evaluated in the context of the global COVID-19 pandemic, which necessitated consideration that the patient might be at risk for infection with the SARS-CoV-2 virus that causes COVID-19. Institutional protocols and algorithms that pertain to the evaluation of patients at risk for COVID-19 are in a state of rapid change based on information released by regulatory bodies including the CDC and federal and state organizations. These policies and algorithms were followed during the patient's care in the ED.  Some ED evaluations and interventions may be delayed as a result of limited staffing during the pandemic.   ____________________________________________   FINAL CLINICAL IMPRESSION(S) / ED DIAGNOSES   Final diagnoses:  Rectal pain      NEW MEDICATIONS STARTED DURING THIS VISIT:  ED Discharge Orders         Ordered    traMADol (ULTRAM) 50 MG tablet  Every 6 hours PRN     01/12/19 [id number]           Note:  This document was prepared using Dragon voice recognition software and may include unintentional dictation errors.    Rudene Re, MD 01/12/19 [phone]

## 2019-01-12 NOTE — ED Triage Notes (Signed)
Pt in with co rectal pain states hx of hemorrhoids. Denies any bleeding, tried preparation h without relief. Here tonight due to inability to sleep due to pain. Pt has hx of the same.

## 2019-01-19 ENCOUNTER — Other Ambulatory Visit: Payer: Self-pay

## 2019-01-19 ENCOUNTER — Telehealth (INDEPENDENT_AMBULATORY_CARE_PROVIDER_SITE_OTHER): Admitting: Sports Medicine

## 2019-01-19 DIAGNOSIS — M25571 Pain in right ankle and joints of right foot: Secondary | ICD-10-CM

## 2019-01-19 DIAGNOSIS — M79671 Pain in right foot: Secondary | ICD-10-CM

## 2019-01-19 NOTE — Assessment & Plan Note (Signed)
Making steady progress with use of amitriptyline Wants to stay at same dose Advised if he gets too many bad days - call us and we will increase to 75mg  HS Use warm foot bath with foot motion Try using some topical arnica gel  Use cushioned shoe/ boot only if needed  Reck 2 mos

## 2019-01-19 NOTE — Progress Notes (Signed)
Patient agrees to video visit for follow up of RT heel pain.  He understands that examination is limited.  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

## 2019-04-17 ENCOUNTER — Other Ambulatory Visit: Payer: Self-pay | Admitting: Sports Medicine

## 2019-04-18 ENCOUNTER — Other Ambulatory Visit: Payer: Self-pay | Admitting: Sports Medicine

## 2019-04-18 MED ORDER — AMITRIPTYLINE HCL 25 MG PO TABS: ORAL_TABLET | ORAL | 3 refills | Status: DC

## 2021-07-29 ENCOUNTER — Other Ambulatory Visit: Payer: Self-pay | Admitting: Surgery

## 2021-08-06 ENCOUNTER — Encounter
Admission: RE | Admit: 2021-08-06 | Discharge: 2021-08-06 | Disposition: A | Discharge status: Home / Self Care | Source: Ambulatory Visit | Attending: Surgery | Admitting: Surgery

## 2021-08-06 HISTORY — DX: Sleep apnea, unspecified: G47.30

## 2021-08-06 NOTE — Patient Instructions (Addendum)
Your procedure is scheduled on:08-12-21 Tuesday Report to the Registration Desk on the 1st floor of the [facility].Then proceed to the 2nd floor Surgery Desk To find out your arrival time, please call [phone] between 1PM - 3PM on:08-11-21 Monday If your arrival time is 6:00 am, do not arrive prior to that time as the [facility] entrance doors do not open until 6:00 am.  REMEMBER: Instructions that are not followed completely may result in serious medical risk, up to and including death; or upon the discretion of your surgeon and anesthesiologist your surgery may need to be rescheduled.  Do not eat food after midnight the night before surgery.  No gum chewing, lozengers or hard candies.  You may however, drink CLEAR liquids up to 2 hours before you are scheduled to arrive for your surgery. Do not drink anything within 2 hours of your scheduled arrival time.  Clear liquids include: - water  - apple juice without pulp - gatorade (not RED colors) - black coffee or tea (Do NOT add milk or creamers to the coffee or tea) Do NOT drink anything that is not on this list.  In addition, your doctor has ordered for you to drink the provided  Ensure Pre-Surgery Clear Carbohydrate Drink  Drinking this carbohydrate drink up to two hours before surgery helps to reduce insulin resistance and improve patient outcomes. Please complete drinking 2 hours prior to scheduled arrival time.  Do NOT take any medication the day of surgery  One week prior to surgery: Stop Anti-inflammatories (NSAIDS) such as Advil, Aleve, Ibuprofen, Motrin, Naproxen, Naprosyn and Aspirin based products such as Excedrin, Goodys Powder, BC Powder.You may however, continue to take Tylenol if needed for pain up until the day of surgery.  Stop ANY OVER THE COUNTER supplements/vitamins NOW (08-06-21) until after surgery.  No Alcohol for 24 hours before or after surgery.  No Smoking including e-cigarettes for 24 hours prior to  surgery.  No chewable tobacco products for at least 6 hours prior to surgery.  No nicotine patches on the day of surgery.  Do not use any "recreational" drugs for at least a week prior to your surgery.  Please be advised that the combination of cocaine and anesthesia may have negative outcomes, up to and including death. If you test positive for cocaine, your surgery will be cancelled.  On the morning of surgery brush your teeth with toothpaste and water, you may rinse your mouth with mouthwash if you wish. Do not swallow any toothpaste or mouthwash.  Use CHG Soap as directed on instruction sheet.  Do not wear jewelry, make-up, hairpins, clips or nail polish.  Do not wear lotions, powders, or perfumes.   Do not shave body from the neck down 48 hours prior to surgery just in case you cut yourself which could leave a site for infection.  Also, freshly shaved skin may become irritated if using the CHG soap.  Contact lenses, hearing aids and dentures may not be worn into surgery.  Do not bring valuables to the hospital. [facility] is not responsible for any missing/lost belongings or valuables.   Bring your C-PAP to the hospital with you   Notify your doctor if there is any change in your medical condition (cold, fever, infection).  Wear comfortable clothing (specific to your surgery type) to the hospital.  After surgery, you can help prevent lung complications by doing breathing exercises.  Take deep breaths and cough every 1-2 hours. Your doctor may order a  device called an Incentive Spirometer to help you take deep breaths. When coughing or sneezing, hold a pillow firmly against your incision with both hands. This is called "splinting." Doing this helps protect your incision. It also decreases belly discomfort.  If you are being admitted to the hospital overnight, leave your suitcase in the car. After surgery it may be brought to your room.  If you are being discharged the day of  surgery, you will not be allowed to drive home. You will need a responsible adult (18 years or older) to drive you home and stay with you that night.   If you are taking public transportation, you will need to have a responsible adult (18 years or older) with you. Please confirm with your physician that it is acceptable to use public transportation.   Please call the [facility] at [phone] if you have any questions about these instructions.  Surgery Visitation Policy:  Patients undergoing a surgery or procedure may have two family members or support persons with them as long as the person is not COVID-19 positive or experiencing its symptoms.    How to Use Chlorhexidine for Bathing Chlorhexidine gluconate (CHG) is a germ-killing (antiseptic) solution that is used to clean the skin. It can get rid of the bacteria that normally live on the skin and can keep them away for about 24 hours. To clean your skin with CHG, you may be given: A CHG solution to use in the shower or as part of a sponge bath. A prepackaged cloth that contains CHG. Cleaning your skin with CHG may help lower the risk for infection: While you are staying in the intensive care unit of the hospital. If you have a vascular access, such as a central line, to provide short-term or long-term access to your veins. If you have a catheter to drain urine from your bladder. If you are on a ventilator. A ventilator is a machine that helps you breathe by moving air in and out of your lungs. After surgery. What are the risks? Risks of using CHG include: A skin reaction. Hearing loss, if CHG gets in your ears and you have a perforated eardrum. Eye injury, if CHG gets in your eyes and is not rinsed out. The CHG product catching fire. Make sure that you avoid smoking and flames after applying CHG to your skin. Do not use CHG: If you have a chlorhexidine allergy or have previously reacted to chlorhexidine. On  babies younger than 27 months of age. How to use CHG solution Use CHG only as told by your health care provider, and follow the instructions on the label. Use the full amount of CHG as directed. Usually, this is one bottle. During a shower Follow these steps when using CHG solution during a shower (unless your health care provider gives you different instructions): Start the shower. Use your normal soap and shampoo to wash your face and hair. Turn off the shower or move out of the shower stream. Pour the CHG onto a clean washcloth. Do not use any type of brush or rough-edged sponge. Starting at your neck, lather your body down to your toes. Make sure you follow these instructions: If you will be having surgery, pay special attention to the part of your body where you will be having surgery. Scrub this area for at least 1 minute. Do not use CHG on your head or face. If the solution gets into your ears or eyes, rinse them well  with water. Avoid your genital area. Avoid any areas of skin that have broken skin, cuts, or scrapes. Scrub your back and under your arms. Make sure to wash skin folds. Let the lather sit on your skin for 1-2 minutes or as long as told by your health care provider. Thoroughly rinse your entire body in the shower. Make sure that all body creases and crevices are rinsed well. Dry off with a clean towel. Do not put any substances on your body afterward--such as powder, lotion, or perfume--unless you are told to do so by your health care provider. Only use lotions that are recommended by the manufacturer. Put on clean clothes or pajamas.   10.If it is the night before your surgery, sleep in clean sheets     How to Use an Incentive Spirometer An incentive spirometer is a tool that measures how well you are filling your lungs with each breath. Learning to take long, deep breaths using this tool can help you keep your lungs clear and active. This may help to reverse or lessen  your chance of developing breathing (pulmonary) problems, especially infection. You may be asked to use a spirometer: After a surgery. If you have a lung problem or a history of smoking. After a long period of time when you have been unable to move or be active. If the spirometer includes an indicator to show the highest number that you have reached, your health care provider or respiratory therapist will help you set a goal. Keep a log of your progress as told by your health care provider. What are the risks? Breathing too quickly may cause dizziness or cause you to pass out. Take your time so you do not get dizzy or light-headed. If you are in pain, you may need to take pain medicine before doing incentive spirometry. It is harder to take a deep breath if you are having pain. How to use your incentive spirometer  Sit up on the edge of your bed or on a chair. Hold the incentive spirometer so that it is in an upright position. Before you use the spirometer, breathe out normally. Place the mouthpiece in your mouth. Make sure your lips are closed tightly around it. Breathe in slowly and as deeply as you can through your mouth, causing the piston or the ball to rise toward the top of the chamber. Hold your breath for 3-5 seconds, or for as long as possible. If the spirometer includes a coach indicator, use this to guide you in breathing. Slow down your breathing if the indicator goes above the marked areas. Remove the mouthpiece from your mouth and breathe out normally. The piston or ball will return to the bottom of the chamber. Rest for a few seconds, then repeat the steps 10 or more times. Take your time and take a few normal breaths between deep breaths so that you do not get dizzy or light-headed. Do this every 1-2 hours when you are awake. If the spirometer includes a goal marker to show the highest number you have reached (best effort), use this as a goal to work toward during each  repetition. After each set of 10 deep breaths, cough a few times. This will help to make sure that your lungs are clear. If you have an incision on your chest or abdomen from surgery, place a pillow or a rolled-up towel firmly against the incision when you cough. This can help to reduce pain while taking deep breaths and coughing.  General tips When you are able to get out of bed: Walk around often. Continue to take deep breaths and cough in order to clear your lungs. Keep using the incentive spirometer until your health care provider says it is okay to stop using it. If you have been in the hospital, you may be told to keep using the spirometer at home. Contact a health care provider if: You are having difficulty using the spirometer. You have trouble using the spirometer as often as instructed. Your pain medicine is not giving enough relief for you to use the spirometer as told. You have a fever. Get help right away if: You develop shortness of breath. You develop a cough with bloody mucus from the lungs. You have fluid or blood coming from an incision site after you cough. Summary An incentive spirometer is a tool that can help you learn to take long, deep breaths to keep your lungs clear and active. You may be asked to use a spirometer after a surgery, if you have a lung problem or a history of smoking, or if you have been inactive for a long period of time. Use your incentive spirometer as instructed every 1-2 hours while you are awake. If you have an incision on your chest or abdomen, place a pillow or a rolled-up towel firmly against your incision when you cough. This will help to reduce pain. Get help right away if you have shortness of breath, you cough up bloody mucus, or blood comes from your incision when you cough. This information is not intended to replace advice given to you by your health care provider. Make sure you discuss any questions you have with your health care  provider. Document Revised: 04/10/2019 Document Reviewed: 04/10/2019 Elsevier Patient Education  [employer]. Please go to the following website to access important education materials concerning your upcoming joint replacement.                                   [email or web address]

## 2021-08-10 MED ORDER — PROPOFOL 10 MG/ML IV BOLUS
INTRAVENOUS | Status: AC
  Filled 2021-08-10: qty 20

## 2021-08-12 ENCOUNTER — Encounter: Payer: Self-pay | Admitting: Surgery

## 2021-08-12 ENCOUNTER — Ambulatory Visit: Payer: No Typology Code available for payment source | Admitting: Certified Registered"

## 2021-08-12 ENCOUNTER — Other Ambulatory Visit: Payer: Self-pay

## 2021-08-12 ENCOUNTER — Encounter
Admission: RE | Disposition: A | Discharge status: Home / Self Care | Payer: Self-pay | Source: Home / Self Care | Attending: Surgery

## 2021-08-12 ENCOUNTER — Ambulatory Visit
Admission: RE | Admit: 2021-08-12 | Discharge: 2021-08-12 | Disposition: A | Discharge status: Home / Self Care | Payer: No Typology Code available for payment source | Attending: Surgery | Admitting: Surgery

## 2021-08-12 DIAGNOSIS — X58XXXA Exposure to other specified factors, initial encounter: Secondary | ICD-10-CM | POA: Diagnosis not present

## 2021-08-12 DIAGNOSIS — E669 Obesity, unspecified: Secondary | ICD-10-CM | POA: Diagnosis not present

## 2021-08-12 DIAGNOSIS — G4730 Sleep apnea, unspecified: Secondary | ICD-10-CM | POA: Diagnosis not present

## 2021-08-12 DIAGNOSIS — M1711 Unilateral primary osteoarthritis, right knee: Secondary | ICD-10-CM | POA: Insufficient documentation

## 2021-08-12 DIAGNOSIS — Z6835 Body mass index (BMI) 35.0-35.9, adult: Secondary | ICD-10-CM | POA: Insufficient documentation

## 2021-08-12 DIAGNOSIS — S83281A Other tear of lateral meniscus, current injury, right knee, initial encounter: Secondary | ICD-10-CM | POA: Diagnosis not present

## 2021-08-12 DIAGNOSIS — M6751 Plica syndrome, right knee: Secondary | ICD-10-CM | POA: Insufficient documentation

## 2021-08-12 DIAGNOSIS — S83241A Other tear of medial meniscus, current injury, right knee, initial encounter: Secondary | ICD-10-CM | POA: Diagnosis not present

## 2021-08-12 HISTORY — PX: KNEE ARTHROSCOPY: SHX127

## 2021-08-12 SURGERY — ARTHROSCOPY, KNEE
Anesthesia: General | Site: Knee | Laterality: Right

## 2021-08-12 MED ORDER — KETOROLAC TROMETHAMINE 30 MG/ML IJ SOLN
30.0000 mg | Freq: Once | INTRAMUSCULAR | Status: AC
  Administered 2021-08-12: 30 mg via INTRAVENOUS

## 2021-08-12 MED ORDER — ONDANSETRON HCL 4 MG/2ML IJ SOLN
4.0000 mg | Freq: Once | INTRAMUSCULAR | Status: DC | PRN
Start: 2021-08-12 — End: 2021-08-12

## 2021-08-12 MED ORDER — EPHEDRINE SULFATE (PRESSORS) 50 MG/ML IJ SOLN
INTRAMUSCULAR | Status: DC | PRN
  Administered 2021-08-12 (×2): 10 mg via INTRAVENOUS

## 2021-08-12 MED ORDER — LIDOCAINE HCL 1 % IJ SOLN
INTRAMUSCULAR | Status: DC | PRN
  Administered 2021-08-12: 30 mL

## 2021-08-12 MED ORDER — FENTANYL CITRATE (PF) 100 MCG/2ML IJ SOLN: 25.0000 ug | INTRAMUSCULAR | Status: DC | PRN

## 2021-08-12 MED ORDER — DEXMEDETOMIDINE (PRECEDEX) IN NS 20 MCG/5ML (4 MCG/ML) IV SYRINGE
PREFILLED_SYRINGE | INTRAVENOUS | Status: DC | PRN
  Administered 2021-08-12: 8 ug via INTRAVENOUS

## 2021-08-12 MED ORDER — FENTANYL CITRATE (PF) 100 MCG/2ML IJ SOLN
INTRAMUSCULAR | Status: DC | PRN
Start: 2021-08-12 — End: 2021-08-12
  Administered 2021-08-12 (×4): 25 ug via INTRAVENOUS

## 2021-08-12 MED ORDER — EPHEDRINE 5 MG/ML INJ
INTRAVENOUS | Status: AC
Start: 2021-08-12 — End: ?
  Filled 2021-08-12: qty 5

## 2021-08-12 MED ORDER — OXYCODONE HCL 5 MG/5ML PO SOLN: 5.0000 mg | Freq: Once | ORAL | Status: AC | PRN

## 2021-08-12 MED ORDER — ONDANSETRON HCL 4 MG/2ML IJ SOLN: 4.0000 mg | Freq: Four times a day (QID) | INTRAMUSCULAR | Status: DC | PRN

## 2021-08-12 MED ORDER — PROPOFOL 10 MG/ML IV BOLUS
INTRAVENOUS | Status: DC | PRN
  Administered 2021-08-12 (×2): 40 mg via INTRAVENOUS
  Administered 2021-08-12: 120 mg via INTRAVENOUS

## 2021-08-12 MED ORDER — FAMOTIDINE 20 MG PO TABS: 20.0000 mg | ORAL_TABLET | Freq: Once | ORAL | Status: AC

## 2021-08-12 MED ORDER — CHLORHEXIDINE GLUCONATE 0.12 % MT SOLN: 15.0000 mL | Freq: Once | OROMUCOSAL | Status: AC

## 2021-08-12 MED ORDER — CEFAZOLIN SODIUM-DEXTROSE 2-4 GM/100ML-% IV SOLN
2.0000 g | INTRAVENOUS | Status: AC
  Administered 2021-08-12: 2 g via INTRAVENOUS

## 2021-08-12 MED ORDER — KETOROLAC TROMETHAMINE 30 MG/ML IJ SOLN
INTRAMUSCULAR | Status: AC
  Filled 2021-08-12: qty 1

## 2021-08-12 MED ORDER — FENTANYL CITRATE (PF) 100 MCG/2ML IJ SOLN
INTRAMUSCULAR | Status: AC
  Filled 2021-08-12: qty 2

## 2021-08-12 MED ORDER — METOCLOPRAMIDE HCL 10 MG PO TABS: 5.0000 mg | ORAL_TABLET | Freq: Three times a day (TID) | ORAL | Status: DC | PRN

## 2021-08-12 MED ORDER — CEFAZOLIN SODIUM-DEXTROSE 2-4 GM/100ML-% IV SOLN
INTRAVENOUS | Status: AC
  Filled 2021-08-12: qty 100

## 2021-08-12 MED ORDER — FAMOTIDINE 20 MG PO TABS
ORAL_TABLET | ORAL | Status: AC
  Administered 2021-08-12: 20 mg via ORAL
  Filled 2021-08-12: qty 1

## 2021-08-12 MED ORDER — RINGERS IRRIGATION IR SOLN
Status: DC | PRN
  Administered 2021-08-12: 6000 mL

## 2021-08-12 MED ORDER — LIDOCAINE HCL (PF) 1 % IJ SOLN
INTRAMUSCULAR | Status: AC
  Filled 2021-08-12: qty 30

## 2021-08-12 MED ORDER — SODIUM CHLORIDE FLUSH 0.9 % IV SOLN
INTRAVENOUS | Status: AC
  Filled 2021-08-12: qty 10

## 2021-08-12 MED ORDER — SODIUM CHLORIDE 0.9 % IV SOLN: INTRAVENOUS | Status: DC

## 2021-08-12 MED ORDER — BUPIVACAINE-EPINEPHRINE (PF) 0.5% -1:200000 IJ SOLN
INTRAMUSCULAR | Status: AC
Start: 2021-08-12 — End: ?
  Filled 2021-08-12: qty 60

## 2021-08-12 MED ORDER — ONDANSETRON HCL 4 MG PO TABS: 4.0000 mg | ORAL_TABLET | Freq: Four times a day (QID) | ORAL | Status: DC | PRN

## 2021-08-12 MED ORDER — OXYCODONE HCL 5 MG PO TABS
ORAL_TABLET | ORAL | Status: AC
  Filled 2021-08-12: qty 1

## 2021-08-12 MED ORDER — ACETAMINOPHEN 10 MG/ML IV SOLN: 1000.0000 mg | Freq: Once | INTRAVENOUS | Status: DC | PRN

## 2021-08-12 MED ORDER — HYDROCODONE-ACETAMINOPHEN 5-325 MG PO TABS: 1.0000 | ORAL_TABLET | ORAL | Status: DC | PRN

## 2021-08-12 MED ORDER — LACTATED RINGERS IV SOLN: INTRAVENOUS | Status: DC

## 2021-08-12 MED ORDER — BUPIVACAINE-EPINEPHRINE (PF) 0.5% -1:200000 IJ SOLN
INTRAMUSCULAR | Status: DC | PRN
  Administered 2021-08-12 (×2): 30 mL via PERINEURAL

## 2021-08-12 MED ORDER — HYDROCODONE-ACETAMINOPHEN 5-325 MG PO TABS: 1.0000 | ORAL_TABLET | Freq: Four times a day (QID) | ORAL | 0 refills | Status: DC | PRN

## 2021-08-12 MED ORDER — OXYCODONE HCL 5 MG PO TABS
5.0000 mg | ORAL_TABLET | Freq: Once | ORAL | Status: AC | PRN
  Administered 2021-08-12: 5 mg via ORAL

## 2021-08-12 MED ORDER — PROPOFOL 10 MG/ML IV BOLUS
INTRAVENOUS | Status: AC
  Filled 2021-08-12: qty 20

## 2021-08-12 MED ORDER — LIDOCAINE HCL (CARDIAC) PF 100 MG/5ML IV SOSY
PREFILLED_SYRINGE | INTRAVENOUS | Status: DC | PRN
  Administered 2021-08-12: 100 mg via INTRAVENOUS

## 2021-08-12 MED ORDER — ORAL CARE MOUTH RINSE: 15.0000 mL | Freq: Once | OROMUCOSAL | Status: AC

## 2021-08-12 MED ORDER — ACETAMINOPHEN 10 MG/ML IV SOLN
INTRAVENOUS | Status: AC
  Filled 2021-08-12: qty 100

## 2021-08-12 MED ORDER — CHLORHEXIDINE GLUCONATE 0.12 % MT SOLN
OROMUCOSAL | Status: AC
  Administered 2021-08-12: 15 mL via OROMUCOSAL
  Filled 2021-08-12: qty 15

## 2021-08-12 MED ORDER — LACTATED RINGERS IV SOLN
INTRAVENOUS | Status: DC
Start: 2021-08-12 — End: 2021-08-12

## 2021-08-12 MED ORDER — SUCCINYLCHOLINE 20MG/ML (10ML) SYRINGE FOR MEDFUSION PUMP - OPTIME
INTRAMUSCULAR | Status: DC | PRN
  Administered 2021-08-12: 100 mg via INTRAVENOUS

## 2021-08-12 MED ORDER — METOCLOPRAMIDE HCL 5 MG/ML IJ SOLN: 5.0000 mg | Freq: Three times a day (TID) | INTRAMUSCULAR | Status: DC | PRN

## 2021-08-12 MED ORDER — MIDAZOLAM HCL 2 MG/2ML IJ SOLN
INTRAMUSCULAR | Status: DC | PRN
  Administered 2021-08-12: 2 mg via INTRAVENOUS

## 2021-08-12 MED ORDER — ONDANSETRON HCL 4 MG/2ML IJ SOLN
INTRAMUSCULAR | Status: DC | PRN
  Administered 2021-08-12: 4 mg via INTRAVENOUS

## 2021-08-12 MED ORDER — LIDOCAINE HCL (PF) 2 % IJ SOLN
INTRAMUSCULAR | Status: AC
  Filled 2021-08-12: qty 5

## 2021-08-12 MED ORDER — MIDAZOLAM HCL 2 MG/2ML IJ SOLN
INTRAMUSCULAR | Status: AC
  Filled 2021-08-12: qty 2

## 2021-08-12 MED ORDER — ACETAMINOPHEN 10 MG/ML IV SOLN
INTRAVENOUS | Status: DC | PRN
  Administered 2021-08-12: 1000 mg via INTRAVENOUS

## 2021-08-12 SURGICAL SUPPLY — 43 items
"PENCIL ELECTRO HAND CTR " (MISCELLANEOUS) ×1 IMPLANT
APL PRP STRL LF DISP 70% ISPRP (MISCELLANEOUS) ×2
BAG COUNTER SPONGE SURGICOUNT (BAG) IMPLANT
BAG SPNG CNTER NS LX DISP (BAG)
BLADE FULL RADIUS 3.5 (BLADE) ×2 IMPLANT
BLADE SHAVER 4.5X7 STR FR (MISCELLANEOUS) ×1 IMPLANT
BNDG ELASTIC 6X5.8 VLCR STR LF (GAUZE/BANDAGES/DRESSINGS) ×2 IMPLANT
BNDG ESMARK 6X12 TAN STRL LF (GAUZE/BANDAGES/DRESSINGS) ×1 IMPLANT
CHLORAPREP W/TINT 26 (MISCELLANEOUS) ×3 IMPLANT
CUFF TOURN SGL QUICK 24 (TOURNIQUET CUFF)
CUFF TOURN SGL QUICK 34 (TOURNIQUET CUFF)
CUFF TRNQT CYL 24X4X16.5-23 (TOURNIQUET CUFF) IMPLANT
CUFF TRNQT CYL 34X4.125X (TOURNIQUET CUFF) IMPLANT
CUP MEDICINE 2OZ PLAST GRAD ST (MISCELLANEOUS) ×2 IMPLANT
DRAPE ARTHRO LIMB 89X125 STRL (DRAPES) ×2 IMPLANT
DRAPE IMP U-DRAPE 54X76 (DRAPES) ×2 IMPLANT
ELECT REM PT RETURN 9FT ADLT (ELECTROSURGICAL)
ELECTRODE REM PT RTRN 9FT ADLT (ELECTROSURGICAL) ×1 IMPLANT
GAUZE SPONGE 4X4 12PLY STRL (GAUZE/BANDAGES/DRESSINGS) ×2 IMPLANT
GLOVE BIO SURGEON STRL SZ8 (GLOVE) ×4 IMPLANT
GLOVE SURG ENC TEXT LTX SZ7 (GLOVE) ×4 IMPLANT
GLOVE SURG UNDER LTX SZ8 (GLOVE) ×2 IMPLANT
GLOVE SURG UNDER POLY LF SZ7.5 (GLOVE) ×2 IMPLANT
GOWN STRL REUS W/ TWL LRG LVL3 (GOWN DISPOSABLE) ×1 IMPLANT
GOWN STRL REUS W/ TWL XL LVL3 (GOWN DISPOSABLE) ×2 IMPLANT
GOWN STRL REUS W/TWL LRG LVL3 (GOWN DISPOSABLE) ×2
GOWN STRL REUS W/TWL XL LVL3 (GOWN DISPOSABLE) ×4
IV LACTATED RINGER IRRG 3000ML (IV SOLUTION) ×8
IV LR IRRIG 3000ML ARTHROMATIC (IV SOLUTION) ×1 IMPLANT
KIT TURNOVER KIT A (KITS) ×2 IMPLANT
MANIFOLD NEPTUNE II (INSTRUMENTS) ×4 IMPLANT
NDL HYPO 21X1.5 SAFETY (NEEDLE) ×1 IMPLANT
NEEDLE HYPO 21X1.5 SAFETY (NEEDLE) ×2 IMPLANT
PACK ARTHROSCOPY KNEE (MISCELLANEOUS) ×2 IMPLANT
PENCIL ELECTRO HAND CTR (MISCELLANEOUS) ×1 IMPLANT
SPONGE T-LAP 18X18 ~~LOC~~+RFID (SPONGE) ×2 IMPLANT
SUT PROLENE 4 0 PS 2 18 (SUTURE) ×2 IMPLANT
SUT TICRON COATED BLUE 2 0 30 (SUTURE) IMPLANT
SYR 30ML LL (SYRINGE) ×2 IMPLANT
SYR 50ML LL SCALE MARK (SYRINGE) ×2 IMPLANT
TUBING INFLOW SET DBFLO PUMP (TUBING) ×2 IMPLANT
WAND WEREWOLF FLOW 90D (MISCELLANEOUS) ×2 IMPLANT
WATER STERILE IRR 500ML POUR (IV SOLUTION) ×1 IMPLANT

## 2021-08-12 NOTE — Op Note (Signed)
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  Postoperative diagnosis:   Same  Procedure:   Debridement of symptomatic plica, partial medial and lateral meniscectomies, and abrasion chondroplasty of femoral trochlea, right knee.  Surgeon:   Pascal Lux, MD  Anesthesia:   General LMA --> GET  Findings:   As above.  There were diffuse grade 3 chondromalacial changes involving the femoral trochlea and superior portion of the patella, as well as grade 2 chondromalacial changes involving the medial and lateral tibial plateaus, and grade 1 chondromalacial changes involving the medial and lateral femoral condyles.  There was moderate central degenerative tearing of the posterior portion of the medial meniscus, as well as mild central degenerative tearing of the midportion of the lateral meniscus.  The anterior and posterior cruciate ligaments both were in excellent condition.  Complications:   None  EBL:   5 cc.  Total fluids:   700 cc of crystalloid.  Tourniquet time:   None  Drains:   None  Closure:   4-0 Prolene interrupted sutures.  Brief clinical note:   The patient is a 56 year old male with a history of progressively worsening anterior right knee pain. His symptoms have persisted despite medications, activity modification, etc. His history and examination are consistent with patellofemoral arthrosis with a large symptomatic plica and a degenerative medial meniscus tear, both of which were identified by preoperative MRI scan. The patient presents at this time for arthroscopy, debridement of the symptomatic plica, and partial medial and lateral meniscectomies.  Procedure:   The patient was brought into the operating room and lain in the supine position. After adequate general laryngeal mask anesthesia was obtained, a timeout was performed to verify the  appropriate side. The patient's right knee was injected sterilely using a solution of 30 cc of 1% lidocaine and 30 cc of 0.5% Sensorcaine with epinephrine. The right lower extremity was prepped with ChloraPrep solution before being draped sterilely. Preoperative antibiotics were administered. The expected portal sites were injected with 0.5% Sensorcaine with epinephrine before the camera was placed in the anterolateral portal and instrumentation performed through the anteromedial portal.  Part way through the procedure, the anesthesiologist converted the patient to a general endotracheal anesthesia due to a poor seal on the LMA.  The knee was sequentially examined beginning in the suprapatellar pouch, then progressing to the patellofemoral space, the medial gutter and compartment, the notch, and finally the lateral compartment and gutter. The findings were as described above. Abundant reactive synovial tissues anteriorly were debrided using the full-radius resector in order to improve visualization. Included in this debridement was a large thickened medial shelf plica extending across the anterior aspect of the medial femoral condyle. This was debrided back to the capsular attachment. It was noted to extend down to the anterior aspect of the knee.    The degenerative tears involving both the posterior portion of the medial meniscus and the lateral portion of the lateral meniscus were debrided back to stable margins using a combination of the small basket and full-radius resector. Subsequent probing of the remaining rims demonstrated excellent stability. In addition, the area of grade 3 chondromalacial changes involving the central portion of the femoral trochlea were lightly abraded back to stable margins using the full-radius resector. The edges were then lightly annealed using the ArthroCare wand set at the low setting. The instruments were removed from  the joint after suctioning the excess fluid.   The  portal sites were closed using 4-0 Prolene interrupted sutures before a sterile bulky dressing was applied to the knee. The patient was then awakened, extubated, and returned to the recovery room in satisfactory condition after tolerating the procedure well.

## 2021-08-12 NOTE — Discharge Instructions (Addendum)
Orthopedic discharge instructions: Keep dressing dry and intact.  May shower after dressing changed on post-op day #4 (Saturday).  Cover sutures with Band-Aids after drying off. Apply ice frequently to knee. Take ibuprofen 800 mg TID with meals for 5-7 days, then as necessary. Take pain medication as prescribed or ES Tylenol when needed.  May weight-bear as tolerated - use crutches or walker as needed. Follow-up in 10-14 days or as scheduled.  AMBULATORY SURGERY  DISCHARGE INSTRUCTIONS   The drugs that you were given will stay in your system until tomorrow so for the next 24 hours you should not:  Drive an automobile Make any legal decisions Drink any alcoholic beverage   You may resume regular meals tomorrow.  Today it is better to start with liquids and gradually work up to solid foods.  You may eat anything you prefer, but it is better to start with liquids, then soup and crackers, and gradually work up to solid foods.   Please notify your doctor immediately if you have any unusual bleeding, trouble breathing, redness and pain at the surgery site, drainage, fever, or pain not relieved by medication.    Additional Instructions:        Please contact your physician with any problems or Same Day Surgery at [phone], Monday through Friday 6 am to 4 pm, or [place] at [facility] number at [phone].

## 2021-08-12 NOTE — Anesthesia Procedure Notes (Signed)
Procedure Name: LMA Insertion Date/Time: 08/12/2021 10:24 AM  Performed by: Natasha Mead, CRNAPre-anesthesia Checklist: Patient identified, Emergency Drugs available, Suction available, Patient being monitored and Timeout performed Patient Re-evaluated:Patient Re-evaluated prior to induction Oxygen Delivery Method: Circle system utilized Preoxygenation: Pre-oxygenation with 100% oxygen Induction Type: IV induction Ventilation: Mask ventilation without difficulty LMA: LMA inserted LMA Size: 5.0 Number of attempts: 1

## 2021-08-12 NOTE — Transfer of Care (Signed)
Immediate Anesthesia Transfer of Care Note  Patient: Thomas Marquez  Procedure(s) Performed: KNEE ARTHROSCOPY WITH DEBRIDEMENT OF A SYMPTOMATIC SUPRAPATELLA PLICA AND PARTIAL MEDIAL AND LATERAL MENISCECTOMY. (Right: Knee)  Patient Location: PACU  Anesthesia Type:General  Level of Consciousness: awake, alert  and oriented  Airway & Oxygen Therapy: Patient Spontanous Breathing and Patient connected to face mask oxygen  Post-op Assessment: Report given to RN and Post -op Vital signs reviewed and stable  Post vital signs: stable  Last Vitals:  Vitals Value Taken Time  BP 140/82 08/12/21 1149  Temp    Pulse 64 08/12/21 1151  Resp 17 08/12/21 1151  SpO2 100 % 08/12/21 1151  Vitals shown include unvalidated device data.  Last Pain:  Vitals:   08/12/21 0902  TempSrc: Temporal  PainSc: 0-No pain         Complications: No notable events documented.

## 2021-08-12 NOTE — Anesthesia Postprocedure Evaluation (Signed)
Anesthesia Post Note  Patient: Thomas Marquez  Procedure(s) Performed: KNEE ARTHROSCOPY WITH DEBRIDEMENT OF A SYMPTOMATIC SUPRAPATELLA PLICA AND PARTIAL MEDIAL AND LATERAL MENISCECTOMY. (Right: Knee)  Patient location during evaluation: PACU Anesthesia Type: General Level of consciousness: awake and alert Pain management: pain level controlled Vital Signs Assessment: post-procedure vital signs reviewed and stable Respiratory status: spontaneous breathing, nonlabored ventilation, respiratory function stable and patient connected to nasal cannula oxygen Cardiovascular status: blood pressure returned to baseline and stable Postop Assessment: no apparent nausea or vomiting Anesthetic complications: no   No notable events documented.   Last Vitals:  Vitals:   08/12/21 1215 08/12/21 1224  BP: 125/82   Pulse: 68 66  Resp: 17 20  Temp:    SpO2: 97% 96%    Last Pain:  Vitals:   08/12/21 1224  TempSrc:   PainSc: [place]

## 2021-08-12 NOTE — Anesthesia Procedure Notes (Signed)
Procedure Name: Intubation Date/Time: 08/12/2021 11:03 AM  Performed by: Natasha Mead, CRNAPre-anesthesia Checklist: Patient identified, Emergency Drugs available, Suction available and Patient being monitored Patient Re-evaluated:Patient Re-evaluated prior to induction Oxygen Delivery Method: Circle system utilized Preoxygenation: Pre-oxygenation with 100% oxygen Induction Type: IV induction Ventilation: Mask ventilation without difficulty Laryngoscope Size: McGraph and 4 Grade View: Grade I Tube type: Oral Tube size: 7.0 mm Number of attempts: 1 Airway Equipment and Method: Stylet and Oral airway Placement Confirmation: ETT inserted through vocal cords under direct vision, positive ETCO2 and breath sounds checked- equal and bilateral Secured at: 23 cm Tube secured with: Tape Dental Injury: Teeth and Oropharynx as per pre-operative assessment

## 2021-08-12 NOTE — Anesthesia Preprocedure Evaluation (Addendum)
Anesthesia Evaluation  Patient identified by MRN, date of birth, ID band Patient awake    Reviewed: Allergy & Precautions, NPO status , Patient's Chart, lab work & pertinent test results  History of Anesthesia Complications Negative for: history of anesthetic complications  Airway Mallampati: III   Neck ROM: Full    Dental no notable dental hx.    Pulmonary sleep apnea and Continuous Positive Airway Pressure Ventilation ,    Pulmonary exam normal breath sounds clear to auscultation       Cardiovascular Exercise Tolerance: Good negative cardio ROS Normal cardiovascular exam Rhythm:Regular Rate:Normal     Neuro/Psych negative neurological ROS     GI/Hepatic negative GI ROS,   Endo/Other  Obesity   Renal/GU negative Renal ROS     Musculoskeletal   Abdominal   Peds  Hematology Skin CA   Anesthesia Other Findings   Reproductive/Obstetrics                            Anesthesia Physical Anesthesia Plan  ASA: 2  Anesthesia Plan: General   Post-op Pain Management:    Induction: Intravenous  PONV Risk Score and Plan: 2 and Ondansetron, Dexamethasone and Treatment may vary due to age or medical condition  Airway Management Planned: LMA  Additional Equipment:   Intra-op Plan:   Post-operative Plan: Extubation in OR  Informed Consent: I have reviewed the patients History and Physical, chart, labs and discussed the procedure including the risks, benefits and alternatives for the proposed anesthesia with the patient or authorized representative who has indicated his/her understanding and acceptance.     Dental advisory given  Plan Discussed with: CRNA  Anesthesia Plan Comments: (Patient consented for risks of anesthesia including but not limited to:  - adverse reactions to medications - damage to eyes, teeth, lips or other oral mucosa - nerve damage due to positioning  - sore  throat or hoarseness - damage to heart, brain, nerves, lungs, other parts of body or loss of life  Informed patient about role of CRNA in peri- and intra-operative care.  Patient voiced understanding.)        Anesthesia Quick Evaluation

## 2021-08-12 NOTE — H&P (Signed)
History of Present Illness: Thomas Marquez is a 56 y.o.male who is being seen for a new patient visit for right knee pain. The symptoms began several years ago and developed without any specific cause or injury. Initially his symptoms are quite manageable, but they have worsened over the past 6 months or so. Initially, he sought evaluation and treatment through the [facility] system. He was evaluated with plain radiographs and an MRI scan but was told that his findings were not severe enough to warrant surgical intervention through the [place] system so he was advised to seek a second opinion. Therefore, he presents to this office for further evaluation and treatment. He reports 4/10 pain in the knee on today's visit. The pain is located along the anterior aspect of the knee. The pain is described as aching, dull, stabbing, and throbbing. The symptoms are aggravated with normal daily activities, using stairs, at higher levels of activity, walking, standing, and exercising. He also describes no mechanical symptoms. He has associated mild swelling and no deformity. He has tried acetaminophen, over-the-counter medications, anti-inflammatories, and ice with limited benefit. The patient notes that he had similar symptoms in his left knee years ago which was treated with an arthroscopic procedure. He was told by the surgeon that he had an extremely large plica that was abrading his patellofemoral compartment and that it was unfortunate he had not been treated earlier in order to try to protect his articular cartilage. Therefore, he is quite concerned about these similar symptoms in his right knee, and would like to have them addressed sooner than later if at all possible in order to try to minimize the likelihood of future complications.  Current Outpatient Medications: diclofenac (VOLTAREN) 1 % topical gel Apply 2 g topically once daily  fluticasone propionate (FLONASE) 50 mcg/actuation nasal spray Place 1 spray into both  nostrils 2 (two) times daily 16 g 0  ibuprofen (ADVIL,MOTRIN) 200 MG tablet Take 200 mg by mouth every 6 (six) hours as needed for Pain  multivitamin tablet Take 1 tablet by mouth once daily   Allergies: No Known Allergies  Past Medical History:  Chickenpox  L4-L5 disc bulge   Past Surgical History:  Hydrocele surgery 1984  Right foot surgery 2009  Left knee meniscus repair 04/04/2013  VASECTOMY   Family History:  No Known Problems Mother  No Known Problems Father  No Known Problems Sister  No Known Problems Brother   Social History:   Socioeconomic History:  Marital status: Married  Occupational History  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  Physical Exam: Vitals:  07/28/21 1357  BP: 136/86  Weight: (!) 107.5 kg (237 lb)  Height: 172.7 cm ('5\' 8"'$ )  PainSc: 4  PainLoc: Knee   General/Constitutional: The patient appears to be well-nourished, well-developed, and in no acute distress. Neuro/Psych: Normal mood and affect, oriented to person, place and time. Eyes: Non-icteric. Pupils are equal,  round, and reactive to light, and exhibit synchronous movement. Lymphatic: No palpable adenopathy. Respiratory: Lungs clear to auscultation, Normal chest excursion, No wheezes, and Non-labored breathing Cardiovascular: Regular rate and rhythm. No murmurs. and No edema,  swelling or tenderness, except as noted in detailed exam. Vascular: No edema, swelling or tenderness, except as noted in detailed exam. Integumentary: No impressive skin lesions present, except as noted in detailed exam. Musculoskeletal: Unremarkable, except as noted in detailed exam.  Right knee exam: GAIT: normal and uses no assistive devices. ALIGNMENT: normal SKIN: unremarkable SWELLING: minimal EFFUSION: trace WARMTH: no warmth TENDERNESS: mild tenderness over the peripatellar region, minimal tenderness over posteromedial joint line ROM: full without pain McMURRAY'S: equivocal PATELLOFEMORAL: Normal tracking with negative apprehension sign CREPITUS: fine patellofemoral crepitance LACHMAN'S: negative PIVOT SHIFT: negative ANTERIOR DRAWER: negative POSTERIOR DRAWER: negative VARUS/VALGUS: stable  He is neurovascularly intact to the right lower extremity and foot.  Knee Imaging, external: Right knee: A recent MRI scan of the right knee has been obtained. By report, the study demonstrates evidence of a "large" superior and medial plica as well as a complex tear of the posterior portion of the lateral meniscus and degenerative tearing of the posterior horn of the medial meniscus. Mild degenerative changes of the patellofemoral compartment also are noted. No significant ligamentous pathology is identified. This report was reviewed by myself and discussed with the patient.  Assessment:  Acute pain of right knee.  Plica syndrome, right knee.  Complex tear of lateral meniscus of right knee.  Degenerative tear of medial meniscus of right knee.   Plan: The treatment options were discussed with the patient. In addition, patient educational materials were provided regarding the diagnosis and treatment options. The patient is frustrated by his continued symptoms and functional limitations and is ready to consider more aggressive treatment options. Given the history he has experienced with  his left knee, as well as the MRI findings noted above, I do feel that the large plica identified on MRI scan may indeed be contributing to his anterior knee symptoms. Therefore, I have recommended a surgical procedure, specifically a right knee arthroscopy with debridement of the large plica and partial meniscectomies. The procedure was discussed with the patient, as were the potential risks (including bleeding, infection, nerve and/or blood vessel injury, persistent or recurrent pain, failure of the repair, progression of arthritis, need for further surgery, blood clots, strokes, heart attacks and/or arhythmias, pneumonia, etc.) and benefits. The patient states his understanding and wishes to proceed. All of the patient's questions and concerns were answered. He can call any time with further concerns. He will return to work without restrictions. He will follow up post-surgery, routine.   H&P reviewed and patient re-examined. No changes.

## 2021-08-12 NOTE — Progress Notes (Signed)
  Chaplain On-Call responded to a call at 0929 from [facility], who reported the patient's request for completion of his Advance Directive documents prior to his surgery.  Nurse Jenny Reichmann told this Chaplain that the surgery is scheduled for 1000.  Chaplain attempted to obtain two Hospital Volunteers who could witness the patient's signature; however, no Volunteer was available in the brief time before the surgery.  Chaplain met the patient, who stated his understanding. Completion of the AD documents can be attempted at another time.  Chaplain Remo Lipps., [facility]

## 2021-08-24 IMAGING — CT CT ABD-PELV W/ CM
2 of 5 series · 16 of 46 positions shown, 18 images · IV contrast (APPLIED)
Comparison: None.

CLINICAL DATA: Rectal pain.  History of hemorrhoids

EXAM:
CT ABDOMEN AND PELVIS WITH CONTRAST
TECHNIQUE: Multidetector CT imaging of the abdomen and pelvis was performed
using the standard protocol following bolus administration of
intravenous contrast.
CONTRAST:  100mL OMNIPAQUE IOHEXOL 300 MG/ML  SOLN

[Series 2: routine abd/pel with · axial · 0.85mm/px · z∈[-1052,-532]mm · 13 of 116 slices shown, 15 images]
[im 6/116  soft-tissue]
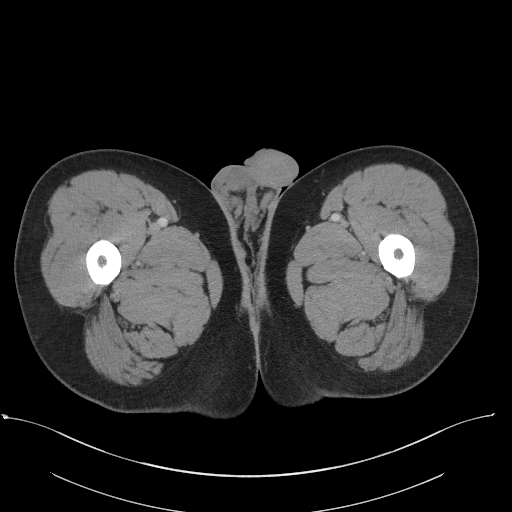
[im 6/116  bone]
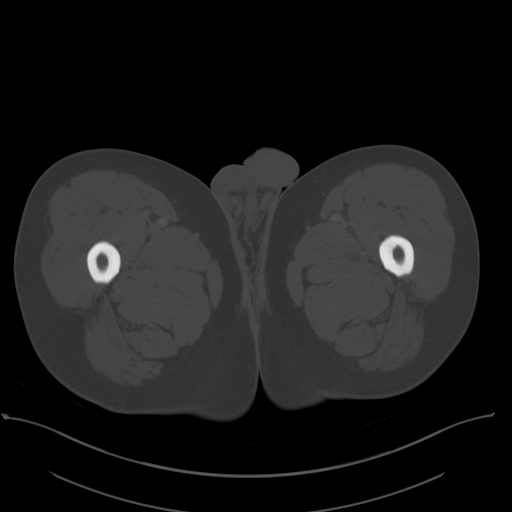
[im 18/116  soft-tissue]
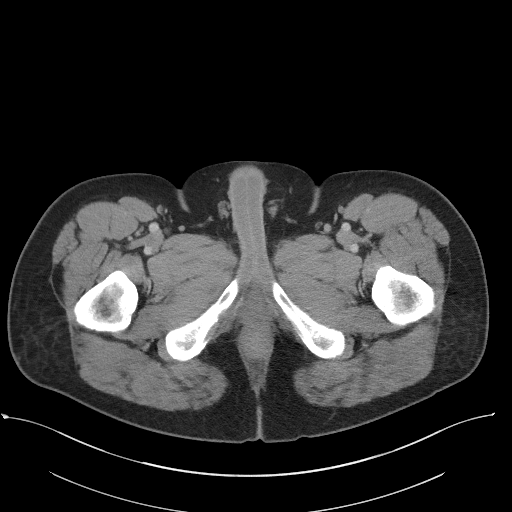
[im 24/116  soft-tissue]
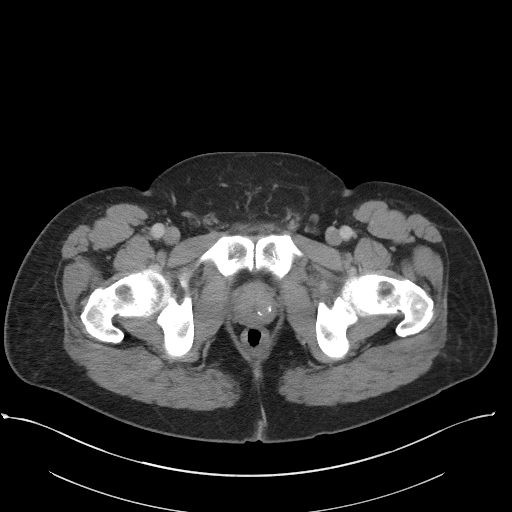
[im 35/116  soft-tissue]
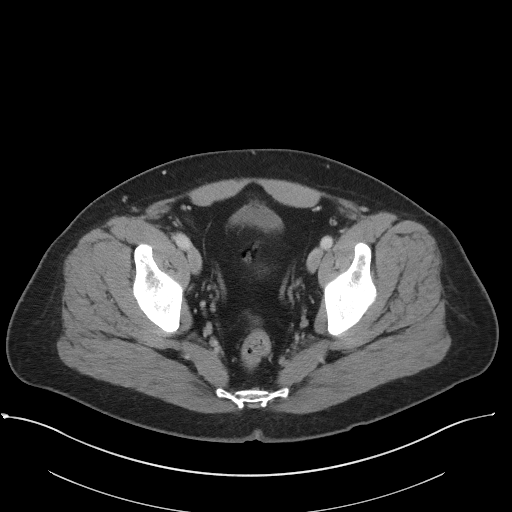
[im 41/116  soft-tissue]
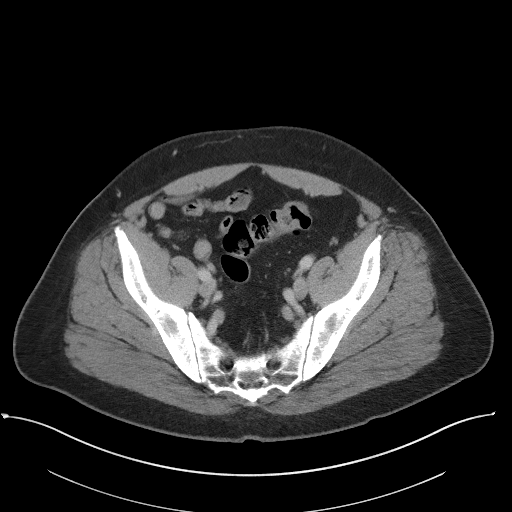
[im 52/116  soft-tissue]
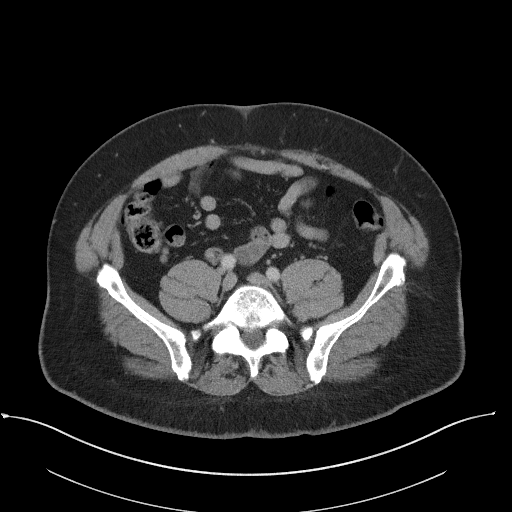
[im 58/116  soft-tissue]
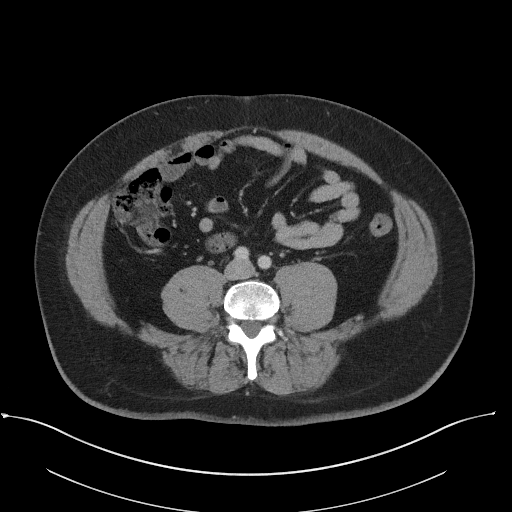
[im 64/116  soft-tissue]
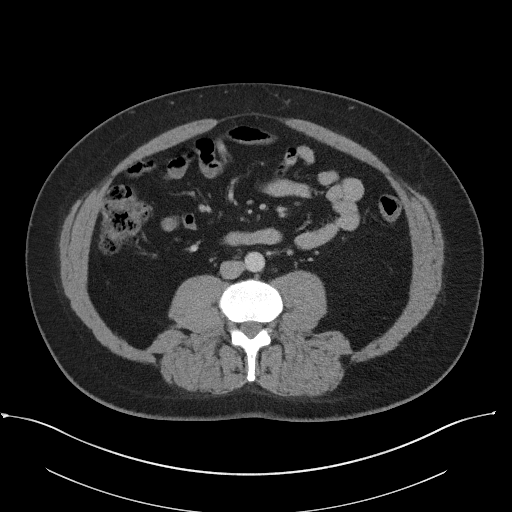
[im 75/116  soft-tissue]
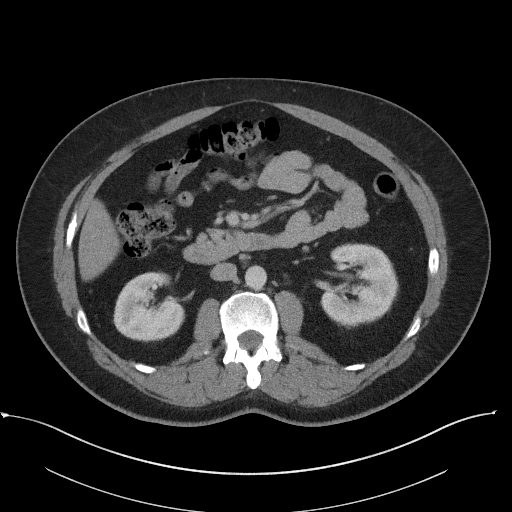
[im 75/116  bone]
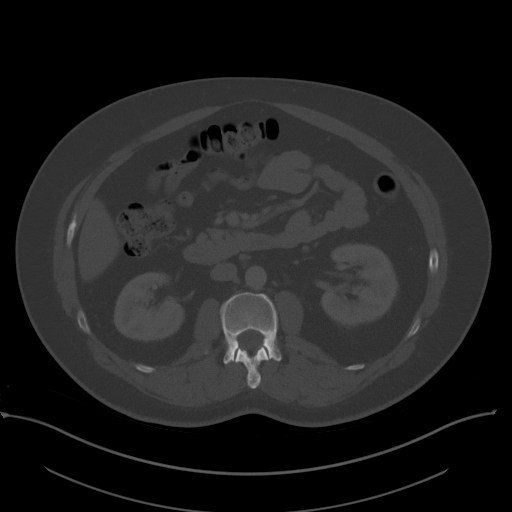
[im 81/116  soft-tissue]
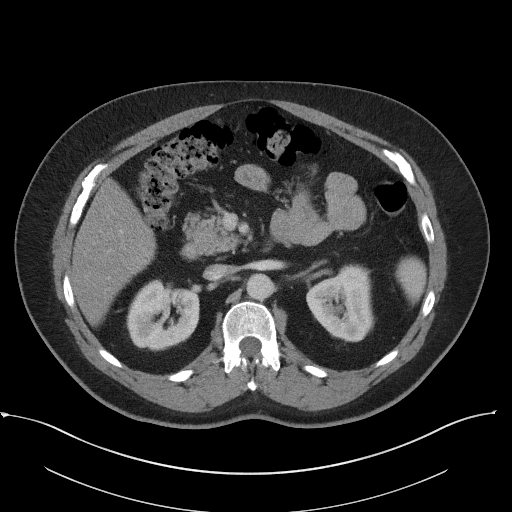
[im 93/116  soft-tissue]
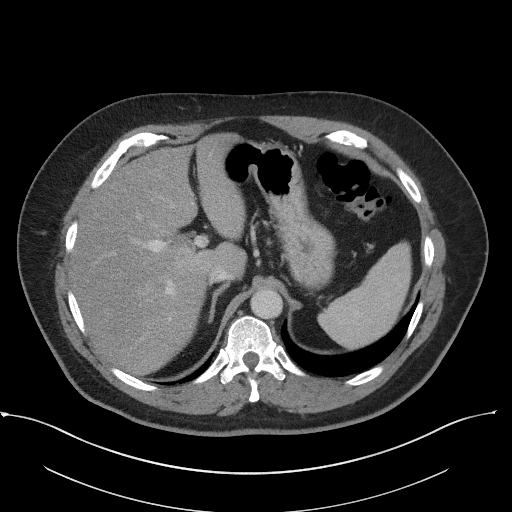
[im 98/116  soft-tissue]
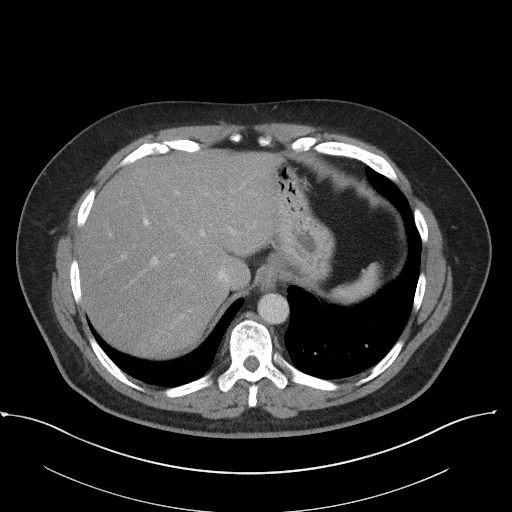
[im 110/116  soft-tissue]
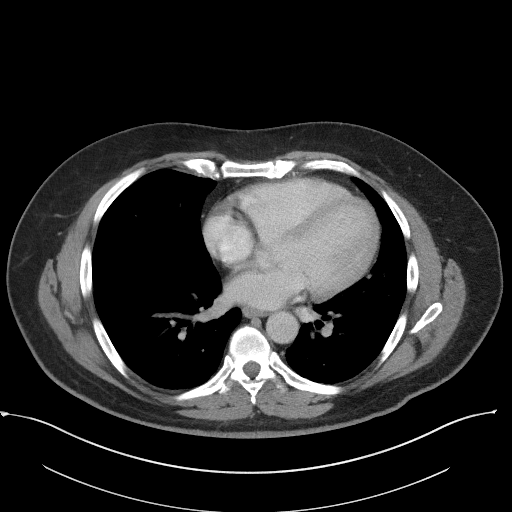

[Series 5: coronal st · coronal · 0.82mm/px · 3 of 88 slices shown]
[im 30/88  soft-tissue]
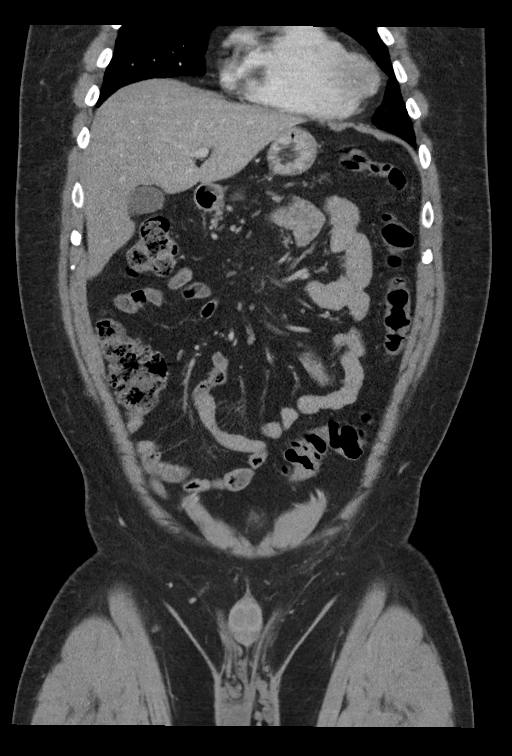
[im 39/88  soft-tissue]
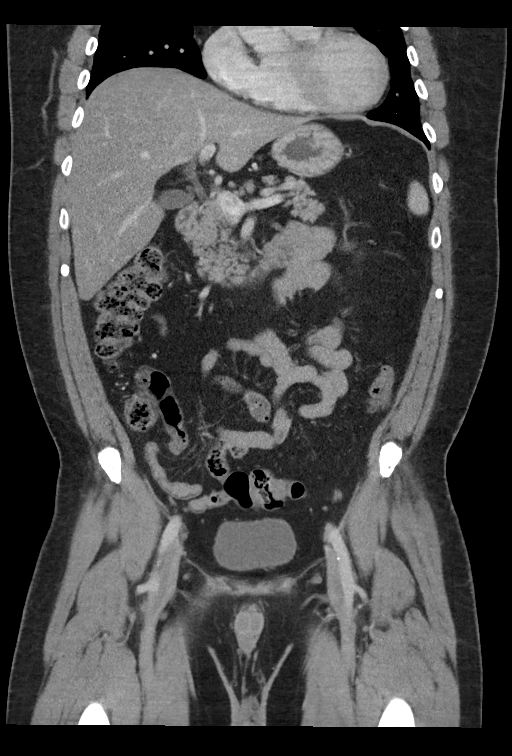
[im 49/88  soft-tissue]
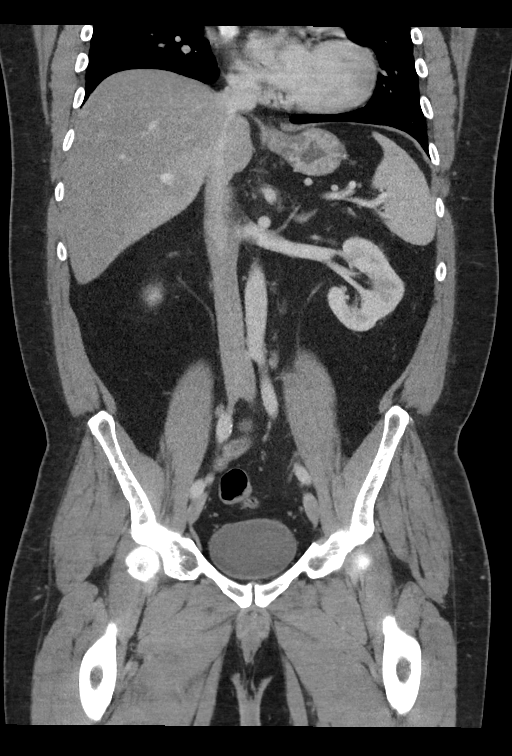

[16 of 46 positions shown; findings below may reference images not displayed]

FINDINGS: Lower chest:  No contributory findings.

Hepatobiliary: Hepatic steatosis. No focal lesion.No evidence of
biliary obstruction or stone.

Pancreas: Unremarkable.

Spleen: Small granulomatous type calcification.

Adrenals/Urinary Tract: Negative adrenals. No hydronephrosis or
stone. Unremarkable bladder.

Stomach/Bowel: No obstruction. No appendicitis. No abnormality seen
around the symptomatic rectum.

Vascular/Lymphatic: No acute vascular abnormality. Mild iliac
atherosclerotic calcification. No mass or adenopathy.

Reproductive:Mild dystrophic calcification in the left aspect
prostate which is mildly enlarged with projection into the bladder
base.

Other: No ascites or pneumoperitoneum.

Musculoskeletal: No acute abnormalities.
IMPRESSION: 1. No explanation for abdominal pain.
2. Hepatic steatosis.

## 2022-01-12 ENCOUNTER — Other Ambulatory Visit: Payer: Self-pay

## 2022-01-12 ENCOUNTER — Emergency Department: Payer: No Typology Code available for payment source

## 2022-01-12 ENCOUNTER — Emergency Department
Admission: EM | Admit: 2022-01-12 | Discharge: 2022-01-12 | Disposition: A | Discharge status: Home / Self Care | Payer: No Typology Code available for payment source | Attending: Emergency Medicine | Admitting: Emergency Medicine

## 2022-01-12 DIAGNOSIS — R0602 Shortness of breath: Secondary | ICD-10-CM | POA: Diagnosis not present

## 2022-01-12 DIAGNOSIS — R079 Chest pain, unspecified: Secondary | ICD-10-CM | POA: Insufficient documentation

## 2022-01-12 DIAGNOSIS — I159 Secondary hypertension, unspecified: Secondary | ICD-10-CM | POA: Diagnosis not present

## 2022-01-12 DIAGNOSIS — R008 Other abnormalities of heart beat: Secondary | ICD-10-CM | POA: Diagnosis present

## 2022-01-12 LAB — CBC
HCT: 48.4 % (ref 39.0–52.0)
Hemoglobin: 16.3 g/dL (ref 13.0–17.0)
MCH: 28.8 pg (ref 26.0–34.0)
MCHC: 33.7 g/dL (ref 30.0–36.0)
MCV: 85.7 fL (ref 80.0–100.0)
Platelets: 305 10*3/uL (ref 150–400)
RBC: 5.65 MIL/uL (ref 4.22–5.81)
RDW: 12.4 % (ref 11.5–15.5)
WBC: 7.7 10*3/uL (ref 4.0–10.5)
nRBC: 0 % (ref 0.0–0.2)

## 2022-01-12 LAB — COMPREHENSIVE METABOLIC PANEL
ALT: 39 U/L (ref 0–44)
AST: 25 U/L (ref 15–41)
Albumin: 4.4 g/dL (ref 3.5–5.0)
Alkaline Phosphatase: 58 U/L (ref 38–126)
Anion gap: 8 (ref 5–15)
BUN: 11 mg/dL (ref 6–20)
CO2: 27 mmol/L (ref 22–32)
Calcium: 9.1 mg/dL (ref 8.9–10.3)
Chloride: 103 mmol/L (ref 98–111)
Creatinine, Ser: 0.83 mg/dL (ref 0.61–1.24)
GFR, Estimated: >60 mL/min (ref >60)
Glucose, Bld: 85 mg/dL (ref 70–99)
Potassium: 3.6 mmol/L (ref 3.5–5.1)
Sodium: 138 mmol/L (ref 135–145)
Total Bilirubin: 0.8 mg/dL (ref 0.3–1.2)
Total Protein: 7.7 g/dL (ref 6.5–8.1)

## 2022-01-12 LAB — TSH: TSH: 1.118 u[IU]/mL (ref 0.350–4.500)

## 2022-01-12 LAB — TROPONIN I (HIGH SENSITIVITY)
Troponin I (High Sensitivity): 3 ng/L (ref <18)
Troponin I (High Sensitivity): 4 ng/L (ref <18)

## 2022-01-12 LAB — D-DIMER, QUANTITATIVE: D-Dimer, Quant: 0.72 ug/mL-FEU — ABNORMAL HIGH (ref 0.00–0.50)

## 2022-01-12 MED ORDER — ASPIRIN 81 MG PO CHEW
324.0000 mg | CHEWABLE_TABLET | Freq: Once | ORAL | Status: AC
  Administered 2022-01-12: 324 mg via ORAL
  Filled 2022-01-12: qty 4

## 2022-01-12 MED ORDER — IOHEXOL 350 MG/ML SOLN
100.0000 mL | Freq: Once | INTRAVENOUS | Status: AC | PRN
  Administered 2022-01-12: 100 mL via INTRAVENOUS

## 2022-01-12 NOTE — Discharge Instructions (Addendum)
You were seen in the emergency department for chest pain.  You had heart enzymes that were negative, do not believe that you are having a heart attack today.  You had a CT scan done that did not show any signs of pneumonia or blood clots.  Is important that you return to the emergency department if your chest pain returns.  Follow-up with your primary care physician and discuss further stress testing and stress management.

## 2022-01-12 NOTE — ED Provider Notes (Signed)
[facility] Provider Note    Event Date/Time   First MD Initiated Contact with Patient 01/12/22 1930     (approximate)   History   Irregular Heart Beat and Chest Pain   HPI  Thomas Marquez is a 55 y.o. male with no significant past medical history presents to the emergency department with chest pain.  Chest pain that started today whenever he was driving to his primary care physician office.  Sharp pain to the left side that was nonradiating.  Associated with some shortness of breath.  Went to his primary care physician and had an EKG and workup done and was ultimately told to come to the emergency department.  No prior stress testing or cardiac catheterization.  Denies any daily medications.  No history of hypertension.  Does endorse recent stress.  No recent travel.  No history of DVT or PE.  No tobacco use.  No family history of coronary artery disease at a young age.     Physical Exam   Triage Vital Signs: ED Triage Vitals  Enc Vitals Group     BP 01/12/22 1500 (!) 174/102     Pulse Rate 01/12/22 1500 (!) 57     Resp 01/12/22 1500 18     Temp 01/12/22 1500 98.2 F (36.8 C)     Temp Source 01/12/22 1500 Oral     SpO2 01/12/22 1500 97 %     Weight 01/12/22 1502 230 lb (104.3 kg)     Height 01/12/22 1502 '5\' 8"'$  (1.727 m)     Head Circumference --      Peak Flow --      Pain Score 01/12/22 1510 6     Pain Loc --      Pain Edu? --      Excl. in [place]? --     Most recent vital signs: Vitals:   01/12/22 1500 01/12/22 1856  BP: (!) 174/102 (!) 166/98  Pulse: (!) 57 (!) 56  Resp: 18 19  Temp: 98.2 F (36.8 C) 98.2 F (36.8 C)  SpO2: 97% 100%    Physical Exam Constitutional:      Appearance: He is well-developed.  HENT:     Head: Atraumatic.  Eyes:     Conjunctiva/sclera: Conjunctivae normal.  Cardiovascular:     Rate and Rhythm: Regular rhythm.  Pulmonary:     Effort: No respiratory distress.  Abdominal:     Palpations: Abdomen is soft.   Musculoskeletal:        General: Normal range of motion.     Cervical back: Normal range of motion.     Right lower leg: No edema.     Left lower leg: No edema.  Skin:    General: Skin is warm.  Neurological:     Mental Status: He is alert. Mental status is at baseline.     IMPRESSION / MDM / ASSESSMENT AND PLAN / ED COURSE  I reviewed the triage vital signs and the nursing notes.  Differential diagnosis including ACS, dysrhythmia, pulmonary embolism, pneumonia, COVID/influenza, hypertension, stress   EKG  I, Nathaniel Man, the attending physician, personally viewed and interpreted this ECG.   Rate: Normal  Rhythm: Normal sinus  Axis: Normal  Intervals: Right bundle branch block  ST&T Change: None  No tachycardic or bradycardic dysrhythmias while on cardiac telemetry.  RADIOLOGY I independently reviewed imaging, my interpretation of imaging: CT angiography with no signs of pulmonary edema, pulmonary embolism.  CT scan was read  as no acute findings.  Noted a small pulmonary nodule without recommendation of follow-up as an outpatient.  No tobacco use with the patient.  Labs (all labs ordered are listed, but only abnormal results are displayed) Labs interpreted as -   Low risk Wells criteria, D-dimer was elevated so CTA was obtained to evaluate for pulmonary embolism.  No thyroid abnormalities.  No significant electrolyte abnormalities.  Serial troponins are negative.  Low suspicion for ACS. Labs Reviewed  D-DIMER, QUANTITATIVE - Abnormal; Notable for the following components:      Result Value   D-Dimer, Quant 0.72 (*)    All other components within normal limits  CBC  COMPREHENSIVE METABOLIC PANEL  TSH  TROPONIN I (HIGH SENSITIVITY)  TROPONIN I (HIGH SENSITIVITY)    Possibly stress-induced.  Discussed close follow-up with primary care physician.  Heart score of 3.  Serial troponins that are negative.  Currently chest pain-free.  Given return precautions to the  emergency department for any repeat chest pain.     PROCEDURES:  Critical Care performed: No  Procedures  Patient's presentation is most consistent with acute presentation with potential threat to life or bodily function.   MEDICATIONS ORDERED IN ED: Medications  aspirin chewable tablet 324 mg (324 mg Oral Given 01/12/22 1516)  iohexol (OMNIPAQUE) 350 MG/ML injection 100 mL (100 mLs Intravenous Contrast Given 01/12/22 1731)    FINAL CLINICAL IMPRESSION(S) / ED DIAGNOSES   Final diagnoses:  Chest pain, unspecified type  Secondary hypertension     Rx / DC Orders   ED Discharge Orders     None        Note:  This document was prepared using Dragon voice recognition software and may include unintentional dictation errors.   Nathaniel Man, MD 01/12/22 2001

## 2022-01-12 NOTE — ED Notes (Signed)
Unknown whether blue top tube sent by triage tech.

## 2022-01-12 NOTE — ED Triage Notes (Signed)
Pt to ED from [facility], sent to ED for further eval because they did EKG and found new RBBB. Pt states BP was high today and normally is 120/80.   States feels chest tightness (since this AM) on deep inspiration and that was having pain in L upper back around shoulder blades. Pain is intermittent and better than this AM.  EKG has been done already and shown to EDP.

## 2022-01-12 NOTE — ED Notes (Signed)
First Nurse Note: Pt to ED from [facility] for chest pain and new onset right bundle branch block. Pt is in NAD.

## 2022-01-12 NOTE — ED Provider Triage Note (Signed)
Emergency Medicine Provider Triage Evaluation Note  Thomas Marquez , a 56 y.o. male  was evaluated in triage.  Pt complains of left-sided chest pain radiating from the back to the front, had a irregular EKG at [facility]  Review of Systems  Positive:  Negative:   Physical Exam  BP (!) 174/102 (BP Location: Left Arm)   Pulse (!) 57   Temp 98.2 F (36.8 C) (Oral)   Resp 18   Ht '5\' 8"'$  (1.727 m)   Wt 104.3 kg   SpO2 97%   BMI 34.97 kg/m  Gen:   Awake, no distress   Resp:  Normal effort  MSK:   Moves extremities without difficulty  Other:    Medical Decision Making  Medically screening exam initiated at 3:05 PM.  Appropriate orders placed.  Thomas Marquez was informed that the remainder of the evaluation will be completed by another provider, this initial triage assessment does not replace that evaluation, and the importance of remaining in the ED until their evaluation is complete.  Cardiac protocols initiated   Versie Starks, PA-C 01/12/22 1506

## 2023-04-27 ENCOUNTER — Ambulatory Visit (INDEPENDENT_AMBULATORY_CARE_PROVIDER_SITE_OTHER): Admitting: Sports Medicine

## 2023-04-27 ENCOUNTER — Encounter: Payer: Self-pay | Admitting: Sports Medicine

## 2023-04-27 VITALS — BP 138/82 | Ht 68.0 in | Wt 235.0 lb

## 2023-04-27 DIAGNOSIS — M7741 Metatarsalgia, right foot: Secondary | ICD-10-CM | POA: Diagnosis not present

## 2023-04-27 NOTE — Progress Notes (Signed)
 PCP: [facility]  Chief Complaint: right foot pain Subjective:   HPI: Patient is a 58 y.o. male here for right foot pain on the plantar side of his foot.  Patient notes a sharp pain on the plantar side of his metatarsals at the second and third digit area.  Patient notes that he went to an urgent care and was told that he has a neuroma of the area.  Patient subsequently had a steroid injection and notes minimal improvement.  Patient states that the pain only occurs when he is wearing his work boots which are steel toed shoes.  Patient has been wearing a running/walking shoes for the past few weeks and notes no pain while he wears those.  Patient states that the pain is all located on the plantar side of his foot.   Past Medical History:  Diagnosis Date   Cancer (HCC)    skin cancer on leg.   Complication of anesthesia 2015   hard to wake up after knee surgery   Sleep apnea    uses cpap    Current Outpatient Medications on File Prior to Visit  Medication Sig Dispense Refill   acetaminophen (TYLENOL) 500 MG tablet Take 1,000 mg by mouth every 6 (six) hours as needed.     HYDROcodone-acetaminophen (NORCO) 5-325 MG tablet Take 1-2 tablets by mouth every 6 (six) hours as needed for moderate pain. MAXIMUM TOTAL ACETAMINOPHEN DOSE IS 4000 MG PER DAY 30 tablet 0   ibuprofen (ADVIL) 200 MG tablet Take 800 mg by mouth every 6 (six) hours as needed.     No current facility-administered medications on file prior to visit.    Past Surgical History:  Procedure Laterality Date   COLONOSCOPY     FOOT FRACTURE SURGERY Right    HYDROCELE EXCISION     as a teenager   KNEE ARTHROSCOPY Right 08/12/2021   Procedure: KNEE ARTHROSCOPY WITH DEBRIDEMENT OF A SYMPTOMATIC SUPRAPATELLA PLICA AND PARTIAL MEDIAL AND LATERAL MENISCECTOMY.;  Surgeon: Christena Flake, MD;  Location: [facility] ORS;  Service: Orthopedics;  Laterality: Right;   KNEE SURGERY Left     No Known Allergies  BP 138/82   Ht 5'  8" (1.727 m)   Wt 235 lb (106.6 kg)   BMI 35.73 kg/m       No data to display              No data to display              Objective:  Physical Exam:  Gen: NAD, comfortable in exam room  Inspection of the bilateral feet: There is noted loss of the transverse arch greater on the right than left.  There is loss of the long arch As well bilaterally.  There is some swelling of the right midfoot compared to the left midfoot.  There is tenderness to palpation over the metatarsals specifically at the second and third digit on the right foot plantar surface.  Assessment & Plan:  1. 1. Metatarsalgia of right foot (Primary) -Suspect the patient's pain is not related to neuroma.  Do think that patient has metatarsalgia on the right foot at the second and third digit.  Patient's orthotics appear to be in good shape, no need for new orthotics as this is unlikely to be causing his pain.  Patient had a metatarsal pad added onto his right orthotic to relieve pressure off of the metatarsals.  Patient was able to try on his work boots  and walk and noted improvement right away.  Will continue with orthotics with metatarsal pad as is.  Patient was advised that if he feels like he may need something stronger then we do have orthotic which will have to be ordered that has more cushioning over the metatarsals.  Patient understanding and agreeable with plan.  Brenton Grills MD, PGY-4  Sports Medicine Fellow [facility]  I observed and examined the patient with the [facility] resident and agree with assessment and plan.  Note reviewed and modified by me. Sterling Big, MD

## 2023-07-19 ENCOUNTER — Ambulatory Visit (INDEPENDENT_AMBULATORY_CARE_PROVIDER_SITE_OTHER): Admitting: Family Medicine

## 2023-07-19 ENCOUNTER — Encounter: Payer: Self-pay | Admitting: Family Medicine

## 2023-07-19 VITALS — BP 147/67 | Ht 68.0 in | Wt 224.0 lb

## 2023-07-19 DIAGNOSIS — M7742 Metatarsalgia, left foot: Secondary | ICD-10-CM | POA: Diagnosis not present

## 2023-07-19 NOTE — Progress Notes (Signed)
 DATE OF VISIT: 07/19/2023        Thomas Marquez DOB: 08/01/1965 MRN: [id number]  CC:  Lt foot pain  History of present Illness: Thomas Marquez is a 58 y.o. 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   HYDROcodone -acetaminophen  (NORCO) 5-325 MG tablet Take 1-2 tablets by mouth every 6 (six) hours as needed for moderate pain. MAXIMUM TOTAL ACETAMINOPHEN  DOSE IS 4000 MG PER DAY   ibuprofen (ADVIL) 200 MG tablet Take 800 mg by mouth every 6 (six) hours as needed.   No facility-administered encounter medications on file as of 07/19/2023.    Allergies: has no known allergies.  Physical Examination: Vitals: BP (!) 147/67 (BP Location: Left Arm, Patient Position: Sitting, Cuff Size: Large)   Ht 5' 8 (1.727 m)   Wt 224 lb (101.6 kg)   BMI 34.06 kg/m  GENERAL:  Thomas Marquez is a 58 y.o. male appearing their stated age, alert and oriented x 3, in no apparent distress.  MSK: Foot: Bilateral feet without any rashes or  lesions.  He has loss of transverse arch bilaterally.  Does have splaying between the 2nd and 3rd, 3rd and 4th and 4th and 5th toes on the left.  Some early hammering of the fourth toe on the left.  Mild tenderness along the metatarsal heads on the left.  No significant tenderness along the interdigital spaces.  Right foot without any tenderness along the metatarsal heads. Walking without a limp Neurovascularly intact distally  Assessment & Plan Metatarsalgia of left foot Due to left foot pain along toes 2 through 5 consistent with metatarsalgia.  Does have visible transverse arch collapse.  Has had similar issues on the right, but not as severe.  The right responded quite well to a metatarsal pad placed on his custom orthotic.  Plan: - Again inspected his orthotics, they are still in good shape - Left orthotic was fitted with a metatarsal cookie to apply increase support throughout his transverse arch.  This was comfortable in the office today and he did feel like it relieves some of the pressure.  Advised him to see how this feels over the next few weeks.  If worsening or not improving should follow-up with us . - Discussed taking ibuprofen 600 mg p.o. nightly to help with sleep - Follow-up if worsening or not improving, otherwise as needed   Patient expressed understanding & agreement with above.  Encounter Diagnosis  Name Primary?   Metatarsalgia of left foot  Yes    No orders of the defined types were placed in this encounter.

## 2023-08-04 ENCOUNTER — Encounter: Payer: Self-pay | Admitting: Emergency Medicine

## 2023-08-04 ENCOUNTER — Other Ambulatory Visit: Payer: Self-pay

## 2023-08-04 ENCOUNTER — Emergency Department
Admission: EM | Admit: 2023-08-04 | Discharge: 2023-08-04 | Disposition: A | Discharge status: Home / Self Care | Attending: Emergency Medicine | Admitting: Emergency Medicine

## 2023-08-04 DIAGNOSIS — R531 Weakness: Secondary | ICD-10-CM | POA: Diagnosis present

## 2023-08-04 LAB — COMPREHENSIVE METABOLIC PANEL WITH GFR
ALT: 32 U/L (ref 0–44)
AST: 22 U/L (ref 15–41)
Albumin: 4 g/dL (ref 3.5–5.0)
Alkaline Phosphatase: 45 U/L (ref 38–126)
Anion gap: 9 (ref 5–15)
BUN: 12 mg/dL (ref 6–20)
CO2: 27 mmol/L (ref 22–32)
Calcium: 8.8 mg/dL — ABNORMAL LOW (ref 8.9–10.3)
Chloride: 103 mmol/L (ref 98–111)
Creatinine, Ser: 0.91 mg/dL (ref 0.61–1.24)
GFR, Estimated: >60 mL/min (ref >60)
Glucose, Bld: 129 mg/dL — ABNORMAL HIGH (ref 70–99)
Potassium: 3.8 mmol/L (ref 3.5–5.1)
Sodium: 139 mmol/L (ref 135–145)
Total Bilirubin: 0.6 mg/dL (ref 0.0–1.2)
Total Protein: 6.9 g/dL (ref 6.5–8.1)

## 2023-08-04 LAB — URINALYSIS, ROUTINE W REFLEX MICROSCOPIC
Bacteria, UA: NONE SEEN
Bilirubin Urine: NEGATIVE
Glucose, UA: NEGATIVE mg/dL
Hgb urine dipstick: NEGATIVE
Ketones, ur: NEGATIVE mg/dL
Nitrite: NEGATIVE
Protein, ur: NEGATIVE mg/dL
Specific Gravity, Urine: 1.012 (ref 1.005–1.030)
pH: 7 (ref 5.0–8.0)

## 2023-08-04 LAB — CBC
HCT: 44.3 % (ref 39.0–52.0)
Hemoglobin: 15.1 g/dL (ref 13.0–17.0)
MCH: 29.2 pg (ref 26.0–34.0)
MCHC: 34.1 g/dL (ref 30.0–36.0)
MCV: 85.5 fL (ref 80.0–100.0)
Platelets: 288 10*3/uL (ref 150–400)
RBC: 5.18 MIL/uL (ref 4.22–5.81)
RDW: 12.8 % (ref 11.5–15.5)
WBC: 7.9 10*3/uL (ref 4.0–10.5)
nRBC: 0 % (ref 0.0–0.2)

## 2023-08-04 LAB — TSH: TSH: 1.337 u[IU]/mL (ref 0.350–4.500)

## 2023-08-04 LAB — BRAIN NATRIURETIC PEPTIDE: B Natriuretic Peptide: 14.1 pg/mL (ref 0.0–100.0)

## 2023-08-04 LAB — TROPONIN I (HIGH SENSITIVITY): Troponin I (High Sensitivity): 3 ng/L (ref <18)

## 2023-08-04 NOTE — ED Triage Notes (Signed)
 Patient to ED via POV for generalized weakness. Ongoing x1 month- worse today. Also having headache x3 days. States pulse has been in the 40's at home and BP in the 100's/-.

## 2023-08-04 NOTE — ED Provider Notes (Signed)
 [facility] Provider Note    Event Date/Time   First MD Initiated Contact with Patient 08/04/23 1403     (approximate)   History   Weakness   HPI  Thomas Marquez is a 58 y.o. male with history of sleep apnea who presents with generalized weakness for the last 1 to 2 months.  He feels this intermittently.  Sometimes he will feel slightly lightheaded as well, but states he is not really dizzy.  He denies any chest pain.  He has no nausea, vomiting, or diarrhea and states that his appetite has been normal.  He has not had any unintentional weight loss.  He denies any fever or chills.  The patient states that he has noticed his heart rate will be low, often in the 40s or 50s.  It has not dipped below this.  Sometimes his blood pressure will be low as well.  He states that he actually went off his blood pressure medication because it was persistently low.  Today he had a blood pressure of 108/54 at home and was concerned.  He has follow-up with his regular physicians at the [facility] tomorrow.  Most of the other blood pressure readings have been more in the range of 110s/70s or higher.  I reviewed the past medical records.  The patient's most recent outpatient encounter was with family medicine on 6/16 for evaluation of foot pain.  He has no recent ED visits or hospitalizations.   Physical Exam   Triage Vital Signs: ED Triage Vitals  Encounter Vitals Group     BP 08/04/23 1310 (!) 157/91     Girls Systolic BP Percentile --      Girls Diastolic BP Percentile --      Boys Systolic BP Percentile --      Boys Diastolic BP Percentile --      Pulse Rate 08/04/23 1310 66     Resp 08/04/23 1310 17     Temp 08/04/23 1310 98 F (36.7 C)     Temp Source 08/04/23 1310 Oral     SpO2 08/04/23 1310 100 %     Weight 08/04/23 1311 227 lb (103 kg)     Height 08/04/23 1311 5' 8 (1.727 m)     Head Circumference --      Peak Flow --      Pain Score 08/04/23 1311 0     Pain Loc --       Pain Education --      Exclude from Growth Chart --     Most recent vital signs: Vitals:   08/04/23 1310 08/04/23 1430  BP: (!) 157/91 127/77  Pulse: 66 64  Resp: 17 13  Temp: 98 F (36.7 C)   SpO2: 100% 96%     General: Alert, comfortable appearing, no distress.  CV:  Good peripheral perfusion.  Normal heart sounds. Resp:  Normal effort.  Lungs CTAB. Abd:  No distention.  Other:  No peripheral edema.  Moist mucous membranes.   ED Results / Procedures / Treatments   Labs (all labs ordered are listed, but only abnormal results are displayed) Labs Reviewed  COMPREHENSIVE METABOLIC PANEL WITH GFR - Abnormal; Notable for the following components:      Result Value   Glucose, Bld 129 (*)    Calcium 8.8 (*)    All other components within normal limits  CBC  TSH  URINALYSIS, ROUTINE W REFLEX MICROSCOPIC  BRAIN NATRIURETIC PEPTIDE  CBG MONITORING, ED  TROPONIN I (HIGH SENSITIVITY)     EKG  ED ECG REPORT I, Waylon Cassis, the attending physician, personally viewed and interpreted this ECG.  Date: 08/04/2023 EKG Time: 1320 Rate: 62 Rhythm: normal sinus rhythm QRS Axis: normal Intervals: normal ST/T Wave abnormalities: Nonspecific T wave abnormalities Narrative Interpretation: no evidence of acute ischemia    RADIOLOGY   PROCEDURES:  Critical Care performed: No  Procedures   MEDICATIONS ORDERED IN ED: Medications - No data to display   IMPRESSION / MDM / ASSESSMENT AND PLAN / ED COURSE  I reviewed the triage vital signs and the nursing notes.  58 year old male with PMH as noted above presents with generalized weakness and some intermittent lightheadedness over the last month associated with low heart rate and blood pressure readings.  On exam currently, his vital signs are normal.  Heart rate is in the 60s.  Physical exam is unremarkable for acute findings.  Differential diagnosis is broad and includes, but is not limited to, symptomatic  bradycardia, dehydration, electrolyte abnormality, chronic kidney disease, anemia, hypothyroidism, UTI or other infection, autoimmune etiology.  The patient has kept a log of vital signs and the blood pressures are usually in a fairly normal range although on the low end of normal.  But it is doubtful that this range would cause generalized weakness or lightheadedness to the degree that the patient has been experiencing.  We will obtain basic labs, cardiac enzymes, TSH, urine, EKG, and reassess.  Patient's presentation is most consistent with acute complicated illness / injury requiring diagnostic workup.  ----------------------------------------- 3:36 PM on 08/04/2023 -----------------------------------------  CBC and CMP show no acute findings.  Troponin is negative.  TSH is normal.  BNP and urinalysis are still pending.  I anticipate discharge home.  The patient has been signed out to the oncoming ED physician Dr. Dorothyann.  FINAL CLINICAL IMPRESSION(S) / ED DIAGNOSES   Final diagnoses:  Weakness     Rx / DC Orders   ED Discharge Orders     None        Note:  This document was prepared using Dragon voice recognition software and may include unintentional dictation errors.    Cassis Waylon, MD 08/04/23 1536

## 2023-08-04 NOTE — Discharge Instructions (Signed)
 Follow-up with the [facility] tomorrow as scheduled.  In the meantime, return to the ER for any new, worsening, or persistent weakness, lightheadedness, feeling like you are going to pass out, difficulty breathing, low blood pressure readings especially below 100 systolic, low heart rate readings below 40, or any other new or worsening symptoms that concern you.

## 2023-11-06 ENCOUNTER — Ambulatory Visit
Admission: EM | Admit: 2023-11-06 | Discharge: 2023-11-06 | Disposition: A | Discharge status: Home / Self Care | Attending: Student | Admitting: Student

## 2023-11-06 ENCOUNTER — Encounter: Payer: Self-pay | Admitting: Emergency Medicine

## 2023-11-06 DIAGNOSIS — S39012A Strain of muscle, fascia and tendon of lower back, initial encounter: Secondary | ICD-10-CM | POA: Diagnosis not present

## 2023-11-06 MED ORDER — METHYLPREDNISOLONE 4 MG PO TBPK: ORAL_TABLET | ORAL | 0 refills | Status: AC

## 2023-11-06 MED ORDER — METAXALONE 800 MG PO TABS: 800.0000 mg | ORAL_TABLET | Freq: Three times a day (TID) | ORAL | 0 refills | Status: AC

## 2023-11-06 NOTE — ED Triage Notes (Signed)
 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  Patient denies any falls.

## 2023-11-06 NOTE — Discharge Instructions (Signed)
-  Medrol dosepack: this is a steroid that will reduce inflammation in your spine -Metaxalone is a muscle relaxer that you can take up to 3x daily. It can cause sleepiness.

## 2023-11-06 NOTE — ED Provider Notes (Addendum)
 [facility]    CSN: [id number] Arrival date & time: 11/06/23  0800      History   Chief Complaint Chief Complaint  Patient presents with   Back Pain    HPI Thomas Marquez is a 58 y.o. male presenting with lower back pain since 11/04/2023, after working on his deck.  He has a history of herniated disc in L4-L5.  He denies trauma or falls.  The pain is concentrated in the left lumbar paraspinous muscles, with sciatica radiating through the left hip.  Sciatica is typical for him during his back pain flare.  Notes mild weakness in the lower extremities, which is also typical for him during back pain plan.  Has been taking diclofenac 50 mg and methocarbamol at home, without much improvement.  Denies change in bowel or bladder function, bowel movements are normal.    HPI  Past Medical History:  Diagnosis Date   Cancer (HCC)    skin cancer on leg.   Complication of anesthesia 2015   hard to wake up after knee surgery   Sleep apnea    uses cpap    Patient Active Problem List   Diagnosis Date Noted   Pain in joint involving right ankle and foot 11/24/2018   Plantar fascia syndrome 02/04/2011   Foot pain, left 12/26/2010   Abnormal gait 11/17/2010   Leg length inequality 11/17/2010   Epicondylitis, lateral 10/21/2010    Past Surgical History:  Procedure Laterality Date   COLONOSCOPY     FOOT FRACTURE SURGERY Right    HYDROCELE EXCISION     as a teenager   KNEE ARTHROSCOPY Right 08/12/2021   Procedure: KNEE ARTHROSCOPY WITH DEBRIDEMENT OF A SYMPTOMATIC SUPRAPATELLA PLICA AND PARTIAL MEDIAL AND LATERAL MENISCECTOMY.;  Surgeon: Edie Norleen PARAS, MD;  Location: [facility] OR[facility];  Service: Orthopedics;  Laterality: Right;   KNEE SURGERY Left        Home Medications    Prior to Admission medications   Medication Sig Start Date End Date Taking? Authorizing Provider  metaxalone (SKELAXIN) 800 MG tablet Take 1 tablet (800 mg total) by mouth 3 (three) times daily. 11/06/23  Yes  Kenidi Elenbaas E, PA-C  methylPREDNISolone (MEDROL DOSEPAK) 4 MG TBPK tablet Take by mouth as directed. Follow directions on package. 11/06/23  Yes Iver Miklas E, PA-C  acetaminophen  (TYLENOL ) 500 MG tablet Take 1,000 mg by mouth every 6 (six) hours as needed.    [provider]  ibuprofen (ADVIL) 200 MG tablet Take 800 mg by mouth every 6 (six) hours as needed.    [provider]    Family History History reviewed. No pertinent family history.  Social History Social History   Tobacco Use   Smoking status: Never   Smokeless tobacco: Never  Vaping Use   Vaping status: Never Used  Substance Use Topics   Alcohol use: Never   Drug use: Never     Allergies   Patient has no known allergies.   Review of Systems Review of Systems  Constitutional:  Negative for chills, fever and unexpected weight change.  Respiratory:  Negative for chest tightness and shortness of breath.   Cardiovascular:  Negative for chest pain and palpitations.  Gastrointestinal:  Negative for abdominal pain, diarrhea, nausea and vomiting.  Genitourinary:  Negative for decreased urine volume, difficulty urinating and frequency.  Musculoskeletal:  Positive for back pain. Negative for arthralgias, gait problem, joint swelling, myalgias, neck pain and neck stiffness.  Skin:  Negative for wound.  Neurological:  Negative for dizziness, tremors, seizures, syncope, facial asymmetry, speech difficulty, weakness, light-headedness, numbness and headaches.     Physical Exam Triage Vital Signs ED Triage Vitals  Encounter Vitals Group     BP 11/06/23 0816 (!) 163/92     Girls Systolic BP Percentile --      Girls Diastolic BP Percentile --      Boys Systolic BP Percentile --      Boys Diastolic BP Percentile --      Pulse Rate 11/06/23 0816 61     Resp 11/06/23 0816 15     Temp 11/06/23 0816 98.4 F (36.9 C)     Temp Source 11/06/23 0816 Oral     SpO2 11/06/23 0816 96 %     Weight 11/06/23 0814  227 lb 1.2 oz (103 kg)     Height 11/06/23 0814 5' 8 (1.727 m)     Head Circumference --      Peak Flow --      Pain Score 11/06/23 0814 6     Pain Loc --      Pain Education --      Exclude from Growth Chart --    No data found.  Updated Vital Signs BP (!) 163/92 (BP Location: Left Arm)   Pulse 61   Temp 98.4 F (36.9 C) (Oral)   Resp 15   Ht 5' 8 (1.727 m)   Wt 227 lb 1.2 oz (103 kg)   SpO2 96%   BMI 34.53 kg/m   Visual Acuity Right Eye Distance:   Left Eye Distance:   Bilateral Distance:    Right Eye Near:   Left Eye Near:    Bilateral Near:     Physical Exam Vitals reviewed.  Constitutional:      General: He is not in acute distress.    Appearance: Normal appearance. He is not ill-appearing.  HENT:     Head: Normocephalic and atraumatic.  Cardiovascular:     Rate and Rhythm: Normal rate and regular rhythm.     Heart sounds: Normal heart sounds.  Pulmonary:     Effort: Pulmonary effort is normal.     Breath sounds: Normal breath sounds and air entry.  Abdominal:     Tenderness: There is no abdominal tenderness. There is no right CVA tenderness, left CVA tenderness, guarding or rebound.  Musculoskeletal:     Cervical back: Normal range of motion. No swelling, deformity, signs of trauma, rigidity, spasms, tenderness, bony tenderness or crepitus. No pain with movement.     Thoracic back: No swelling, deformity, signs of trauma, spasms, tenderness or bony tenderness. Normal range of motion. No scoliosis.     Lumbar back: No swelling, deformity, signs of trauma, spasms, tenderness or bony tenderness. Normal range of motion. Negative right straight leg raise test and negative left straight leg raise test. No scoliosis.     Comments: Strength 5/5 in lower extremities.  Toe walk intact, albeit with some unsteadiness.  Heel walk intact, albeit with some unsteadiness.  Left lumbar paraspinous discomfort elicited with flexion and extension of the lumbar spine.  Positive  left straight leg raise, negative right straight leg raise.  No midline spinous bony tenderness, deformity, stepoff.  Absolutely no other injury, deformity, tenderness, ecchymosis, abrasion.  Neurological:     General: No focal deficit present.     Mental Status: He is alert.     Cranial Nerves: No cranial nerve deficit.  Psychiatric:  Mood and Affect: Mood normal.        Behavior: Behavior normal.        Thought Content: Thought content normal.        Judgment: Judgment normal.      UC Treatments / Results  Labs (all labs ordered are listed, but only abnormal results are displayed) Labs Reviewed - No data to display  EKG   Radiology No results found.  Procedures Procedures (including critical care time)  Medications Ordered in UC Medications - No data to display  Initial Impression / Assessment and Plan / UC Course  I have reviewed the triage vital signs and the nursing notes.  Pertinent labs & imaging results that were available during my care of the patient were reviewed by me and considered in my medical decision making (see chart for details).     Lumbar strain with left sciatica Acute exacerbation of chronic back pain.  Symptoms, including sciatica, are typical for him during a back pain flare.  History of herniation in L4-L5.  Has attempted methocarbamol and diclofenac at home without much improvement.  Will send Medrol dose pack, metaxalone.  He is not a diabetic. Encouraged heat, lidocaine  patch (do not use heat while lidocaine  patches in place). Return precautions and red flag symptoms discussed.   Final Clinical Impressions(s) / UC Diagnoses   Final diagnoses:  Lumbar strain, initial encounter     Discharge Instructions      -Medrol dosepack: this is a steroid that will reduce inflammation in your spine -Metaxalone is a muscle relaxer that you can take up to 3x daily. It can cause sleepiness.     ED Prescriptions     Medication Sig  Dispense Auth. Provider   methylPREDNISolone (MEDROL DOSEPAK) 4 MG TBPK tablet Take by mouth as directed. Follow directions on package. 1 each Catera Hankins E, PA-C   metaxalone (SKELAXIN) 800 MG tablet Take 1 tablet (800 mg total) by mouth 3 (three) times daily. 21 tablet Charene Mccallister E, PA-C      PDMP not reviewed this encounter.   Arlyss Leita BRAVO, PA-C 11/06/23 0849    Arlyss Leita BRAVO, PA-C 11/06/23 [phone]
